# Patient Record
Sex: Female | Born: 1965 | Race: White | Hispanic: No | Smoking: Former smoker
Health system: Southern US, Community
[De-identification: ages and names within clinical notes are randomized; demographics above are authoritative.]

## PROBLEM LIST (undated history)

## (undated) DIAGNOSIS — R519 Headache, unspecified: Secondary | ICD-10-CM

## (undated) DIAGNOSIS — J343 Hypertrophy of nasal turbinates: Secondary | ICD-10-CM

## (undated) DIAGNOSIS — J342 Deviated nasal septum: Secondary | ICD-10-CM

## (undated) DIAGNOSIS — R252 Cramp and spasm: Secondary | ICD-10-CM

## (undated) DIAGNOSIS — R51 Headache: Secondary | ICD-10-CM

## (undated) DIAGNOSIS — I739 Peripheral vascular disease, unspecified: Secondary | ICD-10-CM

## (undated) DIAGNOSIS — C801 Malignant (primary) neoplasm, unspecified: Secondary | ICD-10-CM

## (undated) DIAGNOSIS — Z9109 Other allergy status, other than to drugs and biological substances: Secondary | ICD-10-CM

## (undated) DIAGNOSIS — Z9889 Other specified postprocedural states: Secondary | ICD-10-CM

## (undated) DIAGNOSIS — E039 Hypothyroidism, unspecified: Secondary | ICD-10-CM

## (undated) DIAGNOSIS — R112 Nausea with vomiting, unspecified: Secondary | ICD-10-CM

## (undated) DIAGNOSIS — M1990 Unspecified osteoarthritis, unspecified site: Secondary | ICD-10-CM

## (undated) HISTORY — PX: WISDOM TOOTH EXTRACTION: SHX21

## (undated) HISTORY — DX: Peripheral vascular disease, unspecified: I73.9

## (undated) HISTORY — PX: TUBAL LIGATION: SHX77

## (undated) HISTORY — PX: EYE SURGERY: SHX253

## (undated) HISTORY — PX: TONSILLECTOMY: SUR1361

---

## 2003-04-13 ENCOUNTER — Other Ambulatory Visit: Admission: RE | Admit: 2003-04-13 | Discharge: 2003-04-13 | Payer: Self-pay | Admitting: Obstetrics and Gynecology

## 2010-08-27 ENCOUNTER — Emergency Department (HOSPITAL_COMMUNITY)
Admission: EM | Admit: 2010-08-27 | Discharge: 2010-08-27 | Disposition: A | Discharge status: Home / Self Care | Payer: Managed Care, Other (non HMO) | Attending: Emergency Medicine | Admitting: Emergency Medicine

## 2010-08-27 ENCOUNTER — Emergency Department (HOSPITAL_COMMUNITY): Payer: Managed Care, Other (non HMO)

## 2010-08-27 DIAGNOSIS — M79609 Pain in unspecified limb: Secondary | ICD-10-CM | POA: Insufficient documentation

## 2010-08-27 DIAGNOSIS — S92909A Unspecified fracture of unspecified foot, initial encounter for closed fracture: Secondary | ICD-10-CM | POA: Insufficient documentation

## 2010-08-27 DIAGNOSIS — M25579 Pain in unspecified ankle and joints of unspecified foot: Secondary | ICD-10-CM | POA: Insufficient documentation

## 2010-08-27 DIAGNOSIS — E039 Hypothyroidism, unspecified: Secondary | ICD-10-CM | POA: Insufficient documentation

## 2010-08-27 DIAGNOSIS — W108XXA Fall (on) (from) other stairs and steps, initial encounter: Secondary | ICD-10-CM | POA: Insufficient documentation

## 2012-05-09 IMAGING — CR DG FOOT COMPLETE 3+V*L*
3 series · 3 of 3 positions shown · non-contrast
Comparison: Ankle views today.

CLINICAL DATA: Lateral foot and ankle pain, swelling, bruising.

LEFT FOOT - COMPLETE 3+ VIEW

[t foot ap left]
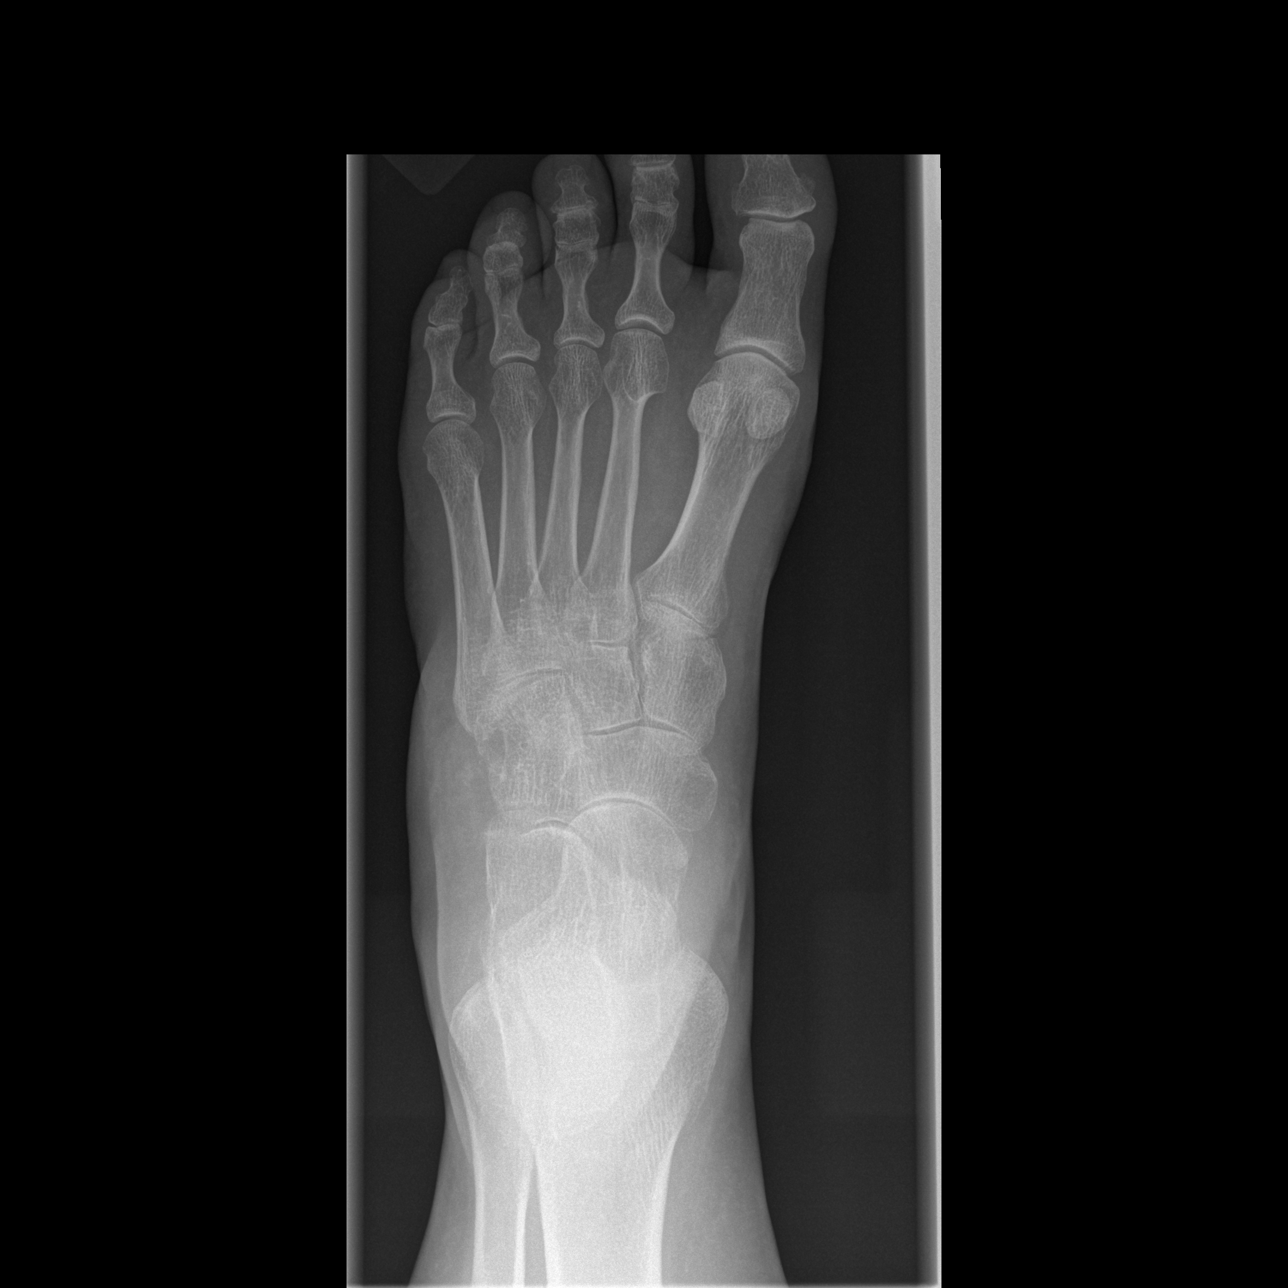

[t foot oblique left]
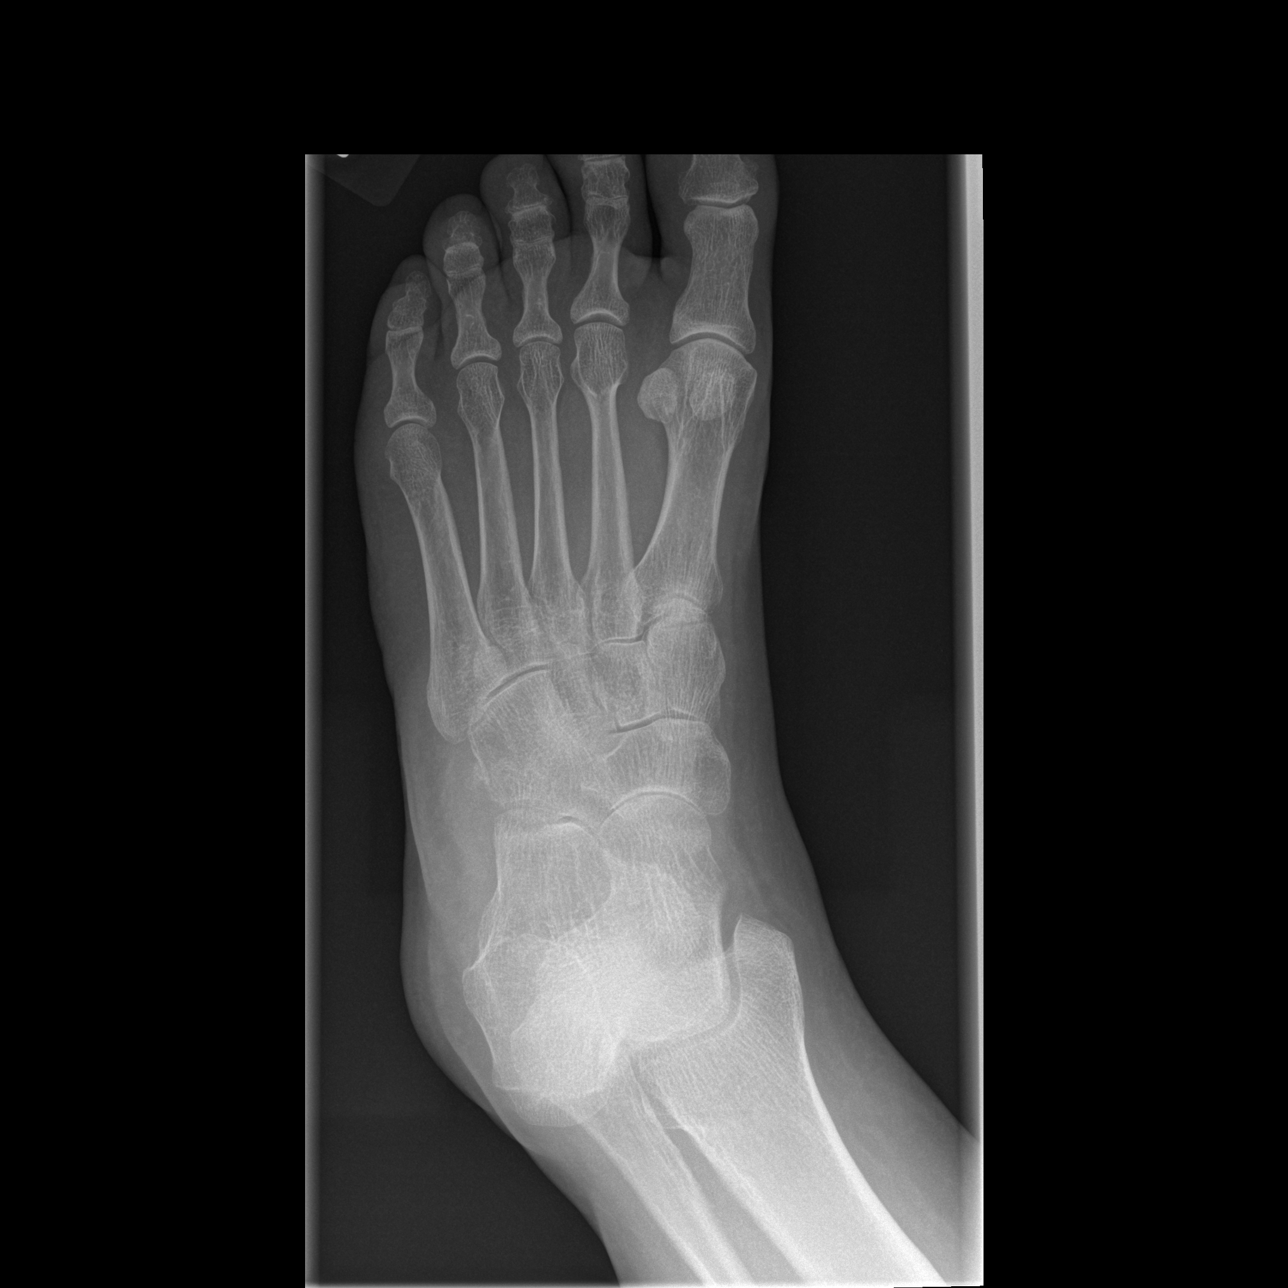

[t foot lat left]
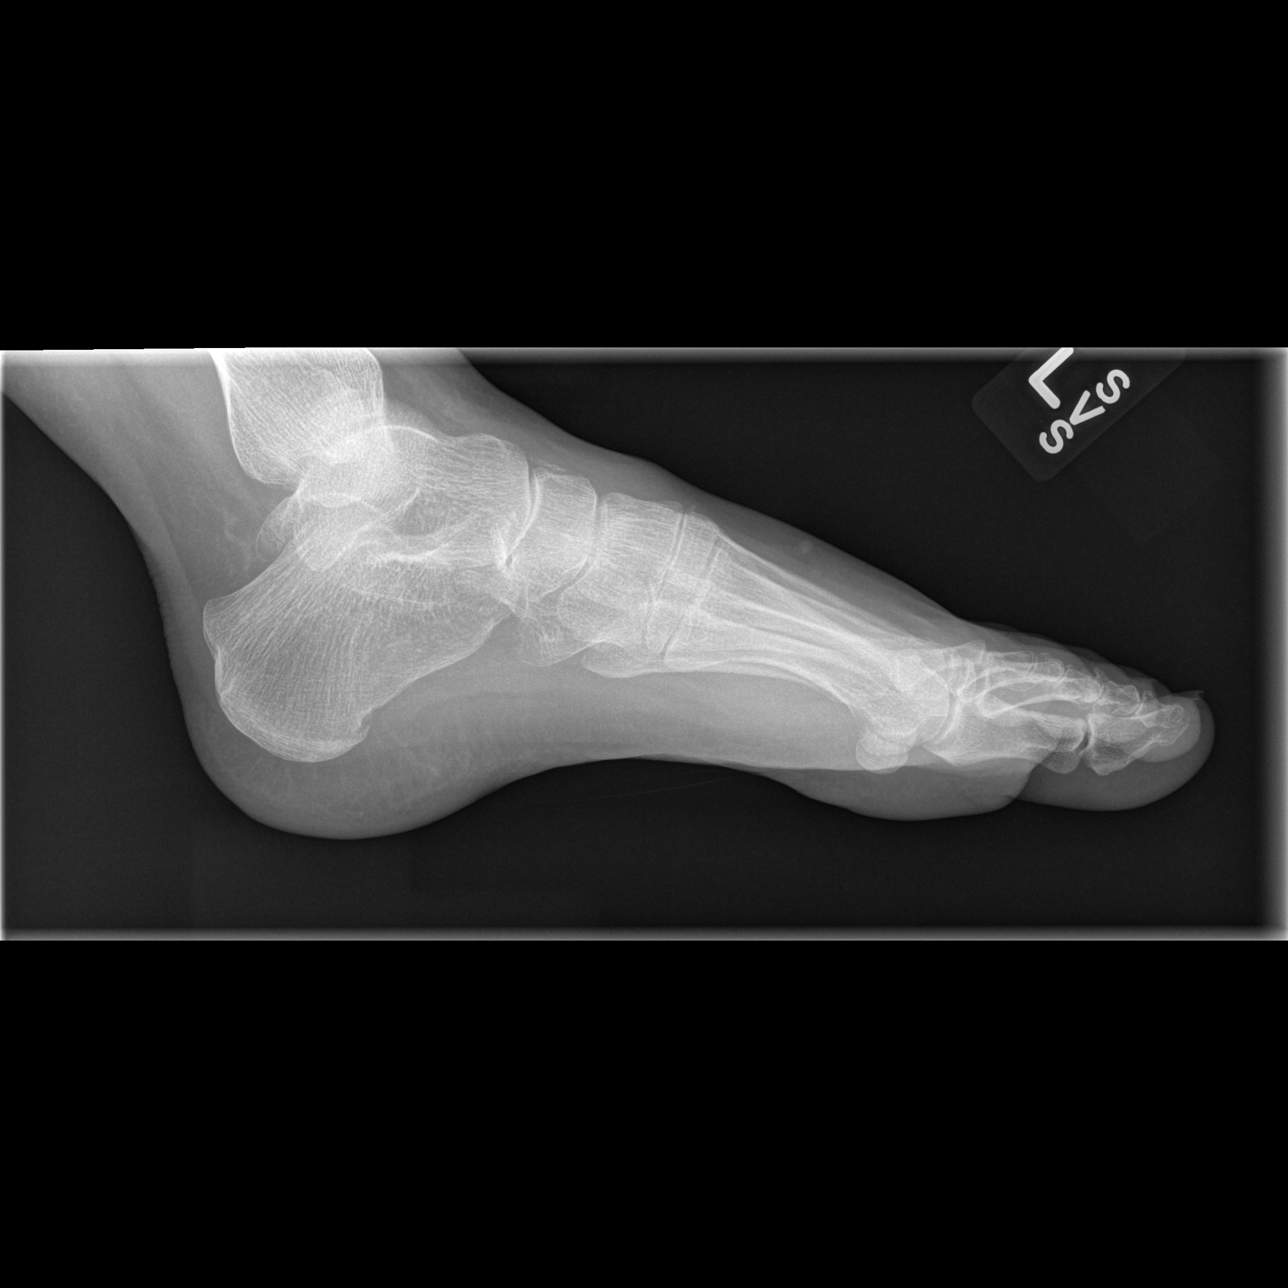

[3 of 3 positions shown; findings below may reference images not displayed]

FINDINGS: There is soft tissue swelling and irregularity in the
region of the cuboid, raising the question of a fracture of the
cuboid.  This may be an avulsion type injury but detail of the
cuboid is limited.  No other fractures are identified.
IMPRESSION: 1.  Significant soft tissue swelling.
2.  Suspect fracture in the region of the cuboid bone.

## 2014-10-13 ENCOUNTER — Encounter: Payer: Self-pay | Admitting: Cardiovascular Disease

## 2014-10-13 ENCOUNTER — Ambulatory Visit (INDEPENDENT_AMBULATORY_CARE_PROVIDER_SITE_OTHER): Payer: Managed Care, Other (non HMO) | Admitting: Cardiovascular Disease

## 2014-10-13 DIAGNOSIS — I739 Peripheral vascular disease, unspecified: Secondary | ICD-10-CM | POA: Diagnosis not present

## 2014-10-13 NOTE — Assessment & Plan Note (Signed)
Katherine Harrison was referred to me by Dr. Harrington Challenger for evaluation of claudication. She's had these symptoms for the last 10 or 15 years. She complains of calf discomfort with ambulation. She also complains of numbness in her hands and feet but apparently has been evaluated by a neurologist for peripheral neuropathy and was told she did not have this. She has no cardiovascular risk factors and 2+ pedal pulses. I doubt whether her symptoms are vascular nature. I will get lower extremity arterial Doppler studies that these are normal I will see her back as needed

## 2014-10-13 NOTE — Patient Instructions (Signed)
Medication Instructions:  Your physician recommends that you continue on your current medications as directed. Please refer to the Current Medication list given to you today.   Labwork: NONE  Testing/Procedures: Your physician has requested that you have a lower extremity arterial doppler- During this test, ultrasound is used to evaluate arterial blood flow in the legs. Allow approximately one hour for this exam.    Follow-Up: Follow up with Dr. Gwenlyn Found as needed.   Any Other Special Instructions Will Be Listed Below (If Applicable).

## 2014-10-13 NOTE — Progress Notes (Signed)
     10/13/2014 Katherine Harrison   1965/07/05  [id number]  Primary Physician No primary care provider on file. Primary Cardiologist: Katherine Harp MD Katherine Harrison   HPI:  Katherine Harrison is a delightful 49 year old moderately overweight married Caucasian female mother of one child who works doing data entry. She was referred by Dr. Harrington Harrison for peripheral vascular evaluation because of symptoms compatible with claudication. Her cardiovascular status profile is notable for remote tobacco abuse having smoked 10-pack-years and stopped over 10 years ago. She has no history of diabetes, hypertension or hyperlipidemia. There is no family history of heart disease. She has never had a heart attack or stroke and denies chest pain or shortness of breath. She did scratch claudication type symptoms over the last 10 or 15 years. She also gets nocturnal cramps in her legs.   Current Outpatient Prescriptions  Medication Sig Dispense Refill  . ARMOUR THYROID 60 MG tablet Take 60 mg by mouth 2 (two) times daily.    . fexofenadine (ALLEGRA) 180 MG tablet Take 180 mg by mouth daily.     No current facility-administered medications for this visit.    Not on File  Social History   Social History  . Marital Status: Married    Spouse Name: N/A  . Number of Children: N/A  . Years of Education: N/A   Occupational History  . Not on file.   Social History Main Topics  . Smoking status: Former Smoker    Quit date: 04/12/1998  . Smokeless tobacco: Not on file  . Alcohol Use: Not on file  . Drug Use: Not on file  . Sexual Activity: Not on file   Other Topics Concern  . Not on file   Social History Narrative  . No narrative on file     Review of Systems: General: negative for chills, fever, night sweats or weight changes.  Cardiovascular: negative for chest pain, dyspnea on exertion, edema, orthopnea, palpitations, paroxysmal nocturnal dyspnea or shortness of breath Dermatological: negative  for rash Respiratory: negative for cough or wheezing Urologic: negative for hematuria Abdominal: negative for nausea, vomiting, diarrhea, bright red blood per rectum, melena, or hematemesis Neurologic: negative for visual changes, syncope, or dizziness All other systems reviewed and are otherwise negative except as noted above.    Blood pressure 116/68, pulse 80, height 5\' 4"  (1.626 m), weight 184 lb (83.462 kg).  General appearance: alert and no distress Neck: no adenopathy, no carotid bruit, no JVD, supple, symmetrical, trachea midline and thyroid not enlarged, symmetric, no tenderness/mass/nodules Lungs: clear to auscultation bilaterally Heart: regular rate and rhythm, S1, S2 normal, no murmur, click, rub or gallop Extremities: extremities normal, atraumatic, no cyanosis or edema and 2+ pedal pulses bilaterally  EKG not performed today  ASSESSMENT AND PLAN:   Claudication Katherine Harrison was referred to me by Dr. Harrington Harrison for evaluation of claudication. She's had these symptoms for the last 10 or 15 years. She complains of calf discomfort with ambulation. She also complains of numbness in her hands and feet but apparently has been evaluated by a neurologist for peripheral neuropathy and was told she did not have this. She has no cardiovascular risk factors and 2+ pedal pulses. I doubt whether her symptoms are vascular nature. I will get lower extremity arterial Doppler studies that these are normal I will see her back as needed      Katherine Harp MD Katherine Harrison, Katherine Harrison 10/13/2014 8:59 AM

## 2014-10-23 ENCOUNTER — Other Ambulatory Visit: Payer: Self-pay | Admitting: Cardiovascular Disease

## 2014-10-23 DIAGNOSIS — I739 Peripheral vascular disease, unspecified: Secondary | ICD-10-CM

## 2014-10-29 ENCOUNTER — Ambulatory Visit (HOSPITAL_COMMUNITY)
Admission: RE | Admit: 2014-10-29 | Discharge: 2014-10-29 | Disposition: A | Discharge status: Home / Self Care | Payer: Managed Care, Other (non HMO) | Source: Ambulatory Visit | Attending: Internal Medicine | Admitting: Internal Medicine

## 2014-10-29 DIAGNOSIS — I739 Peripheral vascular disease, unspecified: Secondary | ICD-10-CM | POA: Diagnosis not present

## 2015-02-02 ENCOUNTER — Other Ambulatory Visit: Payer: Self-pay | Admitting: Obstetrics and Gynecology

## 2015-02-26 NOTE — Patient Instructions (Signed)
Your procedure is scheduled on:  Thursday, Feb. 9, 2017  Enter through the [employer] [facility] at:  10:30 A.M.  Pick up the phone at the desk and dial [phone].  Call this number if you have problems the morning of surgery: [phone].  Remember: Do NOT eat food:  After Midnight Wednesday Do NOT drink clear liquids after:  8:00 A.M. Day of surgery  Take these medicines the morning of surgery with a SIP OF WATER:  Armour thyroid  Do NOT wear jewelry (body piercing), metal hair clips/bobby pins, make-up, or nail polish. Do NOT wear lotions, powders, or perfumes.  You may wear deoderant. Do NOT shave for 48 hours prior to surgery. Do NOT bring valuables to the hospital. Contacts, dentures, or bridgework may not be worn into surgery.  Have a responsible adult drive you home and stay with you for 24 hours after your procedure

## 2015-03-01 ENCOUNTER — Encounter (HOSPITAL_COMMUNITY): Payer: Self-pay

## 2015-03-01 ENCOUNTER — Encounter (HOSPITAL_COMMUNITY)
Admission: RE | Admit: 2015-03-01 | Discharge: 2015-03-01 | Disposition: A | Discharge status: Home / Self Care | Payer: Managed Care, Other (non HMO) | Source: Ambulatory Visit | Attending: Obstetrics and Gynecology | Admitting: Obstetrics and Gynecology

## 2015-03-01 DIAGNOSIS — D259 Leiomyoma of uterus, unspecified: Secondary | ICD-10-CM | POA: Diagnosis not present

## 2015-03-01 DIAGNOSIS — N393 Stress incontinence (female) (male): Secondary | ICD-10-CM | POA: Diagnosis not present

## 2015-03-01 DIAGNOSIS — Z87891 Personal history of nicotine dependence: Secondary | ICD-10-CM | POA: Diagnosis not present

## 2015-03-01 DIAGNOSIS — N920 Excessive and frequent menstruation with regular cycle: Secondary | ICD-10-CM | POA: Diagnosis present

## 2015-03-01 HISTORY — DX: Unspecified osteoarthritis, unspecified site: M19.90

## 2015-03-01 HISTORY — DX: Hypothyroidism, unspecified: E03.9

## 2015-03-01 HISTORY — DX: Headache, unspecified: R51.9

## 2015-03-01 HISTORY — DX: Headache: R51

## 2015-03-01 HISTORY — DX: Other allergy status, other than to drugs and biological substances: Z91.09

## 2015-03-01 HISTORY — DX: Cramp and spasm: R25.2

## 2015-03-01 LAB — CBC
HEMATOCRIT: 36.7 % (ref 36.0–46.0)
HEMOGLOBIN: 11.9 g/dL — AB (ref 12.0–15.0)
MCH: 25.8 pg — ABNORMAL LOW (ref 26.0–34.0)
MCHC: 32.4 g/dL (ref 30.0–36.0)
MCV: 79.4 fL (ref 78.0–100.0)
Platelets: 164 10*3/uL (ref 150–400)
RBC: 4.62 MIL/uL (ref 3.87–5.11)
RDW: 15 % (ref 11.5–15.5)
WBC: 7.4 10*3/uL (ref 4.0–10.5)

## 2015-03-03 MED ORDER — CEFAZOLIN SODIUM-DEXTROSE 2-3 GM-% IV SOLR: 2.0000 g | INTRAVENOUS | Status: DC

## 2015-03-04 ENCOUNTER — Encounter (HOSPITAL_COMMUNITY)
Admission: RE | Disposition: A | Discharge status: Home / Self Care | Payer: Self-pay | Source: Ambulatory Visit | Attending: Obstetrics and Gynecology

## 2015-03-04 ENCOUNTER — Ambulatory Visit (HOSPITAL_COMMUNITY): Payer: Managed Care, Other (non HMO) | Admitting: Certified Registered Nurse Anesthetist

## 2015-03-04 ENCOUNTER — Encounter (HOSPITAL_COMMUNITY): Payer: Self-pay | Admitting: Emergency Medicine

## 2015-03-04 ENCOUNTER — Ambulatory Visit (HOSPITAL_COMMUNITY)
Admission: RE | Admit: 2015-03-04 | Discharge: 2015-03-05 | Disposition: A | Discharge status: Home / Self Care | Payer: Managed Care, Other (non HMO) | Source: Ambulatory Visit | Attending: Obstetrics and Gynecology | Admitting: Obstetrics and Gynecology

## 2015-03-04 DIAGNOSIS — D259 Leiomyoma of uterus, unspecified: Secondary | ICD-10-CM | POA: Insufficient documentation

## 2015-03-04 DIAGNOSIS — N920 Excessive and frequent menstruation with regular cycle: Secondary | ICD-10-CM | POA: Diagnosis not present

## 2015-03-04 DIAGNOSIS — Z87891 Personal history of nicotine dependence: Secondary | ICD-10-CM | POA: Insufficient documentation

## 2015-03-04 DIAGNOSIS — N393 Stress incontinence (female) (male): Secondary | ICD-10-CM | POA: Diagnosis present

## 2015-03-04 HISTORY — PX: CYSTOSCOPY: SHX5120

## 2015-03-04 HISTORY — DX: Nausea with vomiting, unspecified: Z98.890

## 2015-03-04 HISTORY — PX: DILITATION & CURRETTAGE/HYSTROSCOPY WITH HYDROTHERMAL ABLATION: SHX5570

## 2015-03-04 HISTORY — PX: BLADDER SUSPENSION: SHX72

## 2015-03-04 HISTORY — DX: Nausea with vomiting, unspecified: R11.2

## 2015-03-04 SURGERY — DILATATION & CURETTAGE/HYSTEROSCOPY WITH HYDROTHERMAL ABLATION
Anesthesia: Epidural | Site: Vagina

## 2015-03-04 MED ORDER — DEXTROSE IN LACTATED RINGERS 5 % IV SOLN
INTRAVENOUS | Status: DC
  Administered 2015-03-04 (×2): via INTRAVENOUS

## 2015-03-04 MED ORDER — SCOPOLAMINE 1 MG/3DAYS TD PT72
MEDICATED_PATCH | TRANSDERMAL | Status: AC
  Administered 2015-03-04: 1.5 mg via TRANSDERMAL
  Filled 2015-03-04: qty 1

## 2015-03-04 MED ORDER — ONDANSETRON HCL 4 MG/2ML IJ SOLN
INTRAMUSCULAR | Status: AC
  Filled 2015-03-04: qty 2

## 2015-03-04 MED ORDER — LIDOCAINE HCL (CARDIAC) 20 MG/ML IV SOLN
INTRAVENOUS | Status: DC | PRN
  Administered 2015-03-04: 100 mg via INTRAVENOUS

## 2015-03-04 MED ORDER — PROMETHAZINE HCL 25 MG/ML IJ SOLN
6.2500 mg | INTRAMUSCULAR | Status: DC | PRN
  Administered 2015-03-04: 6.25 mg via INTRAVENOUS

## 2015-03-04 MED ORDER — FENTANYL CITRATE (PF) 100 MCG/2ML IJ SOLN
INTRAMUSCULAR | Status: DC | PRN
  Administered 2015-03-04 (×5): 50 ug via INTRAVENOUS

## 2015-03-04 MED ORDER — HYDROMORPHONE HCL 2 MG PO TABS
2.0000 mg | ORAL_TABLET | ORAL | Status: DC | PRN
  Administered 2015-03-04 – 2015-03-05 (×3): 2 mg via ORAL
  Filled 2015-03-04 (×3): qty 1

## 2015-03-04 MED ORDER — LIDOCAINE HCL (CARDIAC) 20 MG/ML IV SOLN
INTRAVENOUS | Status: AC
  Filled 2015-03-04: qty 5

## 2015-03-04 MED ORDER — FENTANYL CITRATE (PF) 250 MCG/5ML IJ SOLN
INTRAMUSCULAR | Status: AC
  Filled 2015-03-04: qty 5

## 2015-03-04 MED ORDER — ONDANSETRON HCL 4 MG PO TABS: 4.0000 mg | ORAL_TABLET | Freq: Four times a day (QID) | ORAL | Status: DC | PRN

## 2015-03-04 MED ORDER — LIDOCAINE HCL 1 % IJ SOLN
INTRAMUSCULAR | Status: AC
  Filled 2015-03-04: qty 20

## 2015-03-04 MED ORDER — ONDANSETRON HCL 4 MG/2ML IJ SOLN
4.0000 mg | Freq: Four times a day (QID) | INTRAMUSCULAR | Status: DC | PRN
  Administered 2015-03-04: 4 mg via INTRAVENOUS
  Filled 2015-03-04: qty 2

## 2015-03-04 MED ORDER — LIDOCAINE-EPINEPHRINE 1 %-1:100000 IJ SOLN
INTRAMUSCULAR | Status: AC
  Filled 2015-03-04: qty 1

## 2015-03-04 MED ORDER — MEPERIDINE HCL 25 MG/ML IJ SOLN: 6.2500 mg | INTRAMUSCULAR | Status: DC | PRN

## 2015-03-04 MED ORDER — DEXAMETHASONE SODIUM PHOSPHATE 4 MG/ML IJ SOLN
INTRAMUSCULAR | Status: AC
  Filled 2015-03-04: qty 1

## 2015-03-04 MED ORDER — PROMETHAZINE HCL 25 MG/ML IJ SOLN
INTRAMUSCULAR | Status: AC
  Administered 2015-03-04: 6.25 mg via INTRAVENOUS
  Filled 2015-03-04: qty 1

## 2015-03-04 MED ORDER — SCOPOLAMINE 1 MG/3DAYS TD PT72
1.0000 | MEDICATED_PATCH | Freq: Once | TRANSDERMAL | Status: DC
  Administered 2015-03-04: 1.5 mg via TRANSDERMAL

## 2015-03-04 MED ORDER — SODIUM CHLORIDE 0.9 % IR SOLN
Status: DC | PRN
  Administered 2015-03-04: 3000 mL

## 2015-03-04 MED ORDER — KETOROLAC TROMETHAMINE 30 MG/ML IJ SOLN
INTRAMUSCULAR | Status: DC | PRN
  Administered 2015-03-04: 30 mg via INTRAVENOUS

## 2015-03-04 MED ORDER — FENTANYL CITRATE (PF) 100 MCG/2ML IJ SOLN
INTRAMUSCULAR | Status: AC
  Filled 2015-03-04: qty 2

## 2015-03-04 MED ORDER — MIDAZOLAM HCL 2 MG/2ML IJ SOLN
INTRAMUSCULAR | Status: DC | PRN
  Administered 2015-03-04: 2 mg via INTRAVENOUS

## 2015-03-04 MED ORDER — SIMETHICONE 80 MG PO CHEW: 80.0000 mg | CHEWABLE_TABLET | Freq: Four times a day (QID) | ORAL | Status: DC | PRN

## 2015-03-04 MED ORDER — FENTANYL CITRATE (PF) 100 MCG/2ML IJ SOLN
25.0000 ug | INTRAMUSCULAR | Status: DC | PRN
  Administered 2015-03-04: 50 ug via INTRAVENOUS

## 2015-03-04 MED ORDER — THYROID 60 MG PO TABS
120.0000 mg | ORAL_TABLET | Freq: Every day | ORAL | Status: DC
  Administered 2015-03-05: 120 mg via ORAL
  Filled 2015-03-04 (×2): qty 2

## 2015-03-04 MED ORDER — MIDAZOLAM HCL 2 MG/2ML IJ SOLN
INTRAMUSCULAR | Status: AC
  Filled 2015-03-04: qty 2

## 2015-03-04 MED ORDER — DEXAMETHASONE SODIUM PHOSPHATE 10 MG/ML IJ SOLN
INTRAMUSCULAR | Status: DC | PRN
  Administered 2015-03-04: 4 mg via INTRAVENOUS

## 2015-03-04 MED ORDER — ONDANSETRON HCL 4 MG/2ML IJ SOLN
INTRAMUSCULAR | Status: DC | PRN
  Administered 2015-03-04: 4 mg via INTRAVENOUS

## 2015-03-04 MED ORDER — SODIUM CHLORIDE 0.9 % IJ SOLN
INTRAMUSCULAR | Status: AC
  Filled 2015-03-04: qty 50

## 2015-03-04 MED ORDER — SODIUM CHLORIDE 0.9 % IJ SOLN
INTRAMUSCULAR | Status: DC | PRN
  Administered 2015-03-04: 50 mL

## 2015-03-04 MED ORDER — LACTATED RINGERS IV SOLN
INTRAVENOUS | Status: DC
  Administered 2015-03-04 (×2): via INTRAVENOUS

## 2015-03-04 MED ORDER — PROPOFOL 10 MG/ML IV BOLUS
INTRAVENOUS | Status: AC
  Filled 2015-03-04: qty 20

## 2015-03-04 MED ORDER — PROPOFOL 10 MG/ML IV BOLUS
INTRAVENOUS | Status: DC | PRN
  Administered 2015-03-04: 200 mg via INTRAVENOUS

## 2015-03-04 MED ORDER — STERILE WATER FOR IRRIGATION IR SOLN
Status: DC | PRN
  Administered 2015-03-04: 1000 mL

## 2015-03-04 MED ORDER — CEFAZOLIN SODIUM-DEXTROSE 2-3 GM-% IV SOLR
INTRAVENOUS | Status: AC
  Administered 2015-03-04: 2 g via INTRAVENOUS
  Filled 2015-03-04: qty 50

## 2015-03-04 MED ORDER — KETOROLAC TROMETHAMINE 30 MG/ML IJ SOLN: 30.0000 mg | Freq: Once | INTRAMUSCULAR | Status: DC

## 2015-03-04 MED ORDER — LIDOCAINE-EPINEPHRINE 1 %-1:100000 IJ SOLN
INTRAMUSCULAR | Status: DC | PRN
  Administered 2015-03-04: 60 mL

## 2015-03-04 MED ORDER — KETOROLAC TROMETHAMINE 30 MG/ML IJ SOLN
INTRAMUSCULAR | Status: AC
  Filled 2015-03-04: qty 1

## 2015-03-04 SURGICAL SUPPLY — 32 items
BLADE SURG 15 STRL LF C SS BP (BLADE) ×2 IMPLANT
BLADE SURG 15 STRL SS (BLADE) ×2
CANISTER SUCT 3000ML (MISCELLANEOUS) ×4 IMPLANT
CATH FOLEY 2WAY SLVR  5CC 18FR (CATHETERS) ×2
CATH FOLEY 2WAY SLVR 5CC 18FR (CATHETERS) ×2 IMPLANT
CATH ROBINSON RED A/P 16FR (CATHETERS) ×4 IMPLANT
CLOTH BEACON ORANGE TIMEOUT ST (SAFETY) ×4 IMPLANT
CONTAINER PREFILL 10% NBF 60ML (FORM) ×4 IMPLANT
DECANTER SPIKE VIAL GLASS SM (MISCELLANEOUS) IMPLANT
GAUZE PACKING IODOFORM 2 (PACKING) IMPLANT
GLOVE BIOGEL PI IND STRL 7.0 (GLOVE) ×2 IMPLANT
GLOVE BIOGEL PI INDICATOR 7.0 (GLOVE) ×2
GLOVE ECLIPSE 7.0 STRL STRAW (GLOVE) ×8 IMPLANT
GOWN STRL REUS W/TWL LRG LVL3 (GOWN DISPOSABLE) ×8 IMPLANT
LIQUID BAND (GAUZE/BANDAGES/DRESSINGS) ×4 IMPLANT
NEEDLE SPNL 18GX3.5 QUINCKE PK (NEEDLE) ×4 IMPLANT
NS IRRIG 1000ML POUR BTL (IV SOLUTION) ×4 IMPLANT
PACK VAGINAL MINOR WOMEN LF (CUSTOM PROCEDURE TRAY) ×4 IMPLANT
PACK VAGINAL WOMENS (CUSTOM PROCEDURE TRAY) IMPLANT
PAD OB MATERNITY 4.3X12.25 (PERSONAL CARE ITEMS) ×4 IMPLANT
SET CYSTO W/LG BORE CLAMP LF (SET/KITS/TRAYS/PACK) ×4 IMPLANT
SET GENESYS HTA PROCERVA (MISCELLANEOUS) ×4 IMPLANT
SLING TVT EXACT (Sling) ×4 IMPLANT
SUT VIC AB 2-0 CT1 27 (SUTURE) ×2
SUT VIC AB 2-0 CT1 TAPERPNT 27 (SUTURE) ×2 IMPLANT
SYR 20CC LL (SYRINGE) ×4 IMPLANT
TOWEL OR 17X24 6PK STRL BLUE (TOWEL DISPOSABLE) ×8 IMPLANT
TRAY FOLEY CATH SILVER 14FR (SET/KITS/TRAYS/PACK) ×4 IMPLANT
TUBING NON-CON 1/4 X 20 CONN (TUBING) ×3 IMPLANT
TUBING NON-CON 1/4 X 20' CONN (TUBING) ×1
WATER STERILE IRR 1000ML POUR (IV SOLUTION) ×4 IMPLANT
YANKAUER SUCT BULB TIP NO VENT (SUCTIONS) ×4 IMPLANT

## 2015-03-04 NOTE — Transfer of Care (Signed)
Immediate Anesthesia Transfer of Care Note  Patient: Katherine Harrison  Procedure(s) Performed: Procedure(s): DILATATION & CURETTAGE/HYSTEROSCOPY WITH HYDROTHERMAL ABLATION (N/A) TRANSVAGINAL TAPE (TVT) PROCEDURE (N/A) CYSTOSCOPY (N/A)  Patient Location: PACU  Anesthesia Type:General  Level of Consciousness: awake, alert  and oriented  Airway & Oxygen Therapy: Patient Spontanous Breathing and Patient connected to nasal cannula oxygen  Post-op Assessment: Report given to RN, Post -op Vital signs reviewed and stable and Patient moving all extremities  Post vital signs: Reviewed and stable  Last Vitals:  Filed Vitals:   03/04/15 1023  BP: 140/75  Pulse: 81  Temp: 36.8 C  Resp: 18    Complications: No apparent anesthesia complications

## 2015-03-04 NOTE — Anesthesia Procedure Notes (Signed)
Procedure Name: LMA Insertion Date/Time: 03/04/2015 12:01 PM Performed by: Hewitt Blade Pre-anesthesia Checklist: Patient identified, Emergency Drugs available, Suction available and Patient being monitored Patient Re-evaluated:Patient Re-evaluated prior to inductionOxygen Delivery Method: Circle system utilized Preoxygenation: Pre-oxygenation with 100% oxygen Intubation Type: IV induction Ventilation: Mask ventilation without difficulty LMA Size: 4.0 Number of attempts: 1 Placement Confirmation: positive ETCO2 and breath sounds checked- equal and bilateral Tube secured with: Tape Dental Injury: Teeth and Oropharynx as per pre-operative assessment

## 2015-03-04 NOTE — Anesthesia Preprocedure Evaluation (Addendum)
Anesthesia Evaluation  Patient identified by MRN, date of birth, ID band Patient awake    Reviewed: Allergy & Precautions, H&P , NPO status , Patient's Chart, lab work & pertinent test results  Airway Mallampati: II  TM Distance: >3 FB Neck ROM: full    Dental no notable dental hx. (+) Teeth Intact   Pulmonary neg pulmonary ROS, former smoker,    Pulmonary exam normal        Cardiovascular negative cardio ROS Normal cardiovascular exam     Neuro/Psych negative psych ROS   GI/Hepatic negative GI ROS, Neg liver ROS,   Endo/Other    Renal/GU negative Renal ROS     Musculoskeletal   Abdominal (+) + obese,   Peds  Hematology negative hematology ROS (+)   Anesthesia Other Findings   Reproductive/Obstetrics negative OB ROS                            Anesthesia Physical Anesthesia Plan  ASA: II  Anesthesia Plan: General   Post-op Pain Management:    Induction: Intravenous  Airway Management Planned: LMA  Additional Equipment:   Intra-op Plan:   Post-operative Plan:   Informed Consent: I have reviewed the patients History and Physical, chart, labs and discussed the procedure including the risks, benefits and alternatives for the proposed anesthesia with the patient or authorized representative who has indicated his/her understanding and acceptance.     Plan Discussed with: CRNA and Surgeon  Anesthesia Plan Comments:        Anesthesia Quick Evaluation

## 2015-03-04 NOTE — H&P (Signed)
Katherine Harrison is an 50 y.o. white female, G1P1 who presents to the OR for an HTA ablation and TVT for a several yr h.o. Worsening menorrhagia and SUI. She also has know uterine fibroids. She has a nl endometrial biopsy. She has had a BTL.   Past Medical History  Diagnosis Date  . Claudication ([place])   . Environmental allergies   . Hypothyroidism   . Headache     migraine, visual aura-take aleve immediately to avoid migraines  . Muscle cramps     bilateral legs  . OA (osteoarthritis)   . PONV (postoperative nausea and vomiting)     Past Surgical History  Procedure Laterality Date  . Wisdom tooth extraction    . Tonsillectomy    . Eye surgery      eye muscle shortening surgery as toddler  . Tubal ligation      History reviewed. No pertinent family history. Social History:  reports that she quit smoking about 16 years ago. Her smoking use included Cigarettes. She has a 7.5 pack-year smoking history. She has never used smokeless tobacco. She reports that she drinks alcohol. She reports that she does not use illicit drugs.  Allergies:   Blood pressure 140/75, pulse 81, temperature 98.2 F (36.8 C), temperature source Oral, resp. rate 18, last menstrual period 02/10/2015, SpO2 100 %. General appearance: alert Lungs: clear to auscultation bilaterally Heart: regular rate and rhythm, S1, S2 normal, no murmur, click, rub or gallop Abdomen: soft, non-tender; bowel sounds normal; no masses,  no organomegaly   Lab Results  Component Value Date   WBC 7.4 03/01/2015   HGB 11.9* 03/01/2015   HCT 36.7 03/01/2015   MCV 79.4 03/01/2015   PLT 164 03/01/2015   No results found for: PREGTESTUR, PREGSERUM, HCG, HCGQUANT    Patient Active Problem List   Diagnosis Date Noted  . Claudication ([place]) 10/13/2014   IMP/ menorrhagia          SUI Plan/ Proceed with Hysteroscopy/ablation/D&C/TVT/cysto  ANDERSON,MARK E 03/04/2015, 11:21 AM

## 2015-03-04 NOTE — Anesthesia Postprocedure Evaluation (Signed)
Anesthesia Post Note  Patient: Katherine Harrison  Procedure(s) Performed: Procedure(s) (LRB): DILATATION & CURETTAGE/HYSTEROSCOPY WITH HYDROTHERMAL ABLATION (N/A) TRANSVAGINAL TAPE (TVT) PROCEDURE (N/A) CYSTOSCOPY (N/A)  Patient location during evaluation: PACU Anesthesia Type: General Level of consciousness: sedated Pain management: pain level controlled Vital Signs Assessment: post-procedure vital signs reviewed and stable Respiratory status: spontaneous breathing Cardiovascular status: stable Anesthetic complications: yes Anesthetic complication details: PONVComments: Being treated forPONV with Phenergan after all meds per protocol had been given.    Last Vitals:  Filed Vitals:   03/04/15 1023  BP: 140/75  Pulse: 81  Temp: 36.8 C  Resp: 18    Last Pain: There were no vitals filed for this visit.               [place]

## 2015-03-05 ENCOUNTER — Encounter (HOSPITAL_COMMUNITY): Payer: Self-pay | Admitting: Obstetrics and Gynecology

## 2015-03-05 DIAGNOSIS — N920 Excessive and frequent menstruation with regular cycle: Secondary | ICD-10-CM | POA: Diagnosis not present

## 2015-03-05 MED ORDER — HYDROMORPHONE HCL 2 MG PO TABS
2.0000 mg | ORAL_TABLET | ORAL | Status: DC | PRN
Start: 2015-03-05 — End: 2022-08-07

## 2015-03-05 NOTE — Progress Notes (Signed)
Continued voiding trails:  11:48 am - Pt voided 550, bladder scan 310 13:41 pm - Pt voided 600, bladder scan 229  Pt feels that her bladder is empty after she voids.  Phoned Dr. Ouida Sills with update.  He stated that patient could be discharged as ordered earlier.  Pt is to call MD if she feels unable to empty her over the weekend and she should RTO on Monday or Tuesday to be bladder scanned in the office.

## 2015-03-05 NOTE — Op Note (Signed)
NAMEMALONIE, DEGREE NO.:  [id number]  MEDICAL RECORD NO.:  [id number]  LOCATION:  9302                          FACILITY:  [facility]  PHYSICIAN:  Freda Munro, M.D.    DATE OF BIRTH:  1965/10/15  DATE OF PROCEDURE:  03/04/2015 DATE OF DISCHARGE:                              OPERATIVE REPORT   PREOPERATIVE DIAGNOSES: 1. Menorrhagia. 2. Uterine fibroids. 3. Stress urinary incontinence.  POSTOPERATIVE DIAGNOSES: 1. Menorrhagia. 2. Uterine fibroids. 3. Stress urinary incontinence.  PROCEDURE: 1. Hysteroscopy. 2. HTA endometrial ablation. 3. TVT Exact urethropexy. 4. Cystoscopy.  SURGEON:  Freda Munro, MD  ANESTHESIA:  General and local.  ANTIBIOTICS:  Ancef 2 g.  DRAINS:  Foley bedside drainage.  ESTIMATED BLOOD LOSS:  40 mL.  SPECIMENS:  None.  COMPLICATIONS:  None.  PROCEDURE IN DETAIL:  The patient was taken to the operating room, where she was placed in a dorsal supine position.  A general anesthetic was administered without difficulty.  She was then placed in a dorsal lithotomy position.  She was prepped and draped in usual fashion for this procedure.  Her bladder was drained with a red rubber catheter.  A sterile speculum was placed in the vagina.  Her cervix was injected with 1% lidocaine for a paracervical block.  Single-tooth tenaculum was applied to the anterior cervical lip.  The cervix was serially dilated with 29-French.  The hysteroscope was set up for the HT ablation.  The hysteroscope was advanced through the endocervical canal which appeared to be normal on entering the uterine cavity.  The patient had thickened endometrium.  However, there was no evidence of a submucous fibroid. Once this was accomplished, HTA machine was ran through its preop set up.  Once no leak was identified, the machine was turned on.  A very adequate ablation was completed.  The device was removed.  Single-tooth tenaculum was removed and the speculum  removed.  This concluded the endometrial ablation.  At this point, a weighted speculum was placed in vagina.  A dilute solution of 1% lidocaine was placed in the retropubic space about 40 mL was placed there and also in the anterior abdominal wall, 10 mL periurethral on both sides.  At this point, a 1 cm incision was made 1 cm distal from the urethral meatus.  Metzenbaum scissors were used to open the periurethral space bilaterally.  At this point, the TVT Exact device was set up.  A catheter on a stylette was placed in the bladder and the bladder was deviated to the patient's left.  The trocar was placed in the right periurethral space and then up through the retropubic space and then through the skin.  A similar procedure was performed on the opposite side.  At this point, cystoscopy was performed.  There was no evidence of any foreign material in the urethra or the bladder.  Both ureters were seen to be functioning normally. There was no evidence of any foreign bodies or stones in the bladder. The bladder wall appeared to be completely normal.  At this point, the bladder was drained.  The cystoscope removed and the sling tightened after a Kelly clamp was placed between the  sling and the urethra.  At this point, the plastic sleeves on the sling were removed and final placement and adjustment of the sling was performed.  The excess material on the sling was removed and then the vaginal opening was closed with 2-0 Vicryl in a running, locking fashion.  Urine was seen to be clear coming from the bladder after a Foley catheter was placed. This concluded the procedure.  The patient was awoken and taken to recovery room in stable condition.  Instrument and lap count was correct x2.          ______________________________ Freda Munro, M.D.     MA/MEDQ  D:  03/04/2015  T:  03/05/2015  Job:  [id number]

## 2015-03-05 NOTE — Anesthesia Postprocedure Evaluation (Signed)
Anesthesia Post Note  Patient: Katherine Harrison  Procedure(s) Performed: Procedure(s) (LRB): DILATATION & CURETTAGE/HYSTEROSCOPY WITH HYDROTHERMAL ABLATION (N/A) TRANSVAGINAL TAPE (TVT) PROCEDURE (N/A) CYSTOSCOPY (N/A)  Patient location during evaluation: [facility] Anesthesia Type: General Level of consciousness: awake and alert and oriented Pain management: pain level controlled Vital Signs Assessment: post-procedure vital signs reviewed and stable Respiratory status: spontaneous breathing Cardiovascular status: blood pressure returned to baseline Postop Assessment: adequate PO intake and no signs of nausea or vomiting (initial postop nausea & vomiting, resolved) Anesthetic complications: no    Last Vitals:  Filed Vitals:   03/05/15 0124 03/05/15 0611  BP: 134/64 138/49  Pulse: 94 75  Temp: 36.9 C 36.6 C  Resp: 18 18    Last Pain:  Filed Vitals:   03/05/15 0830  PainSc: 5                  Hamdi Vari

## 2015-03-05 NOTE — Progress Notes (Signed)
Voiding trials:  6:12 am foley catheter removed with 1600 ml urine in bag 7:48 pt voided 300 ml 10:06 pt voided 450 ml 10:28 pt voided 550 ml, bladder scan 212 ml  Phoned Dr. Ouida Sills with above results.  He instructed nursing to measure 2 more voids and bladder scan each, then call him with results.  Shared with patient who stated she would call RN when she voids.

## 2015-03-05 NOTE — Progress Notes (Signed)
Pt discharged to home with husband.  Condition stable. Discharge instructions reviewed with pt and husband together.  Pt to car via wheelchair with RN.  No equipment for home ordered at discharge.

## 2015-03-05 NOTE — Addendum Note (Signed)
Addendum  created 03/05/15 0919 by Talbot Grumbling, CRNA   Modules edited: Clinical Notes   Clinical Notes:  File: [id number]

## 2015-03-05 NOTE — Progress Notes (Signed)
POD#1 Pt withoput complaints. Now doing voiding trial. She tolerates diet. Adequate pain control Will plan on discharge later today

## 2019-03-24 ENCOUNTER — Other Ambulatory Visit: Payer: Self-pay

## 2019-03-24 ENCOUNTER — Ambulatory Visit: Payer: 59

## 2019-03-24 ENCOUNTER — Other Ambulatory Visit: Payer: Self-pay | Admitting: Physician Assistant

## 2019-03-24 DIAGNOSIS — R202 Paresthesia of skin: Secondary | ICD-10-CM

## 2019-03-24 DIAGNOSIS — R600 Localized edema: Secondary | ICD-10-CM

## 2020-12-04 IMAGING — US US EXTREM LOW VENOUS*L*
1 series · 13 of 24 positions shown · non-contrast
Comparison: None.

CLINICAL DATA: Left lower extremity edema and paresthesia. Bruising
reported.

EXAM:
LEFT LOWER EXTREMITY VENOUS DOPPLER ULTRASOUND
TECHNIQUE: Gray-scale sonography with compression, as well as color and duplex
ultrasound, were performed to evaluate the deep venous system(s)
from the level of the common femoral vein through the popliteal and
proximal calf veins.

[Series 1: us extrem low venous*left* · 0.07mm/px · 13 of 42 slices shown]
[im 1/42]
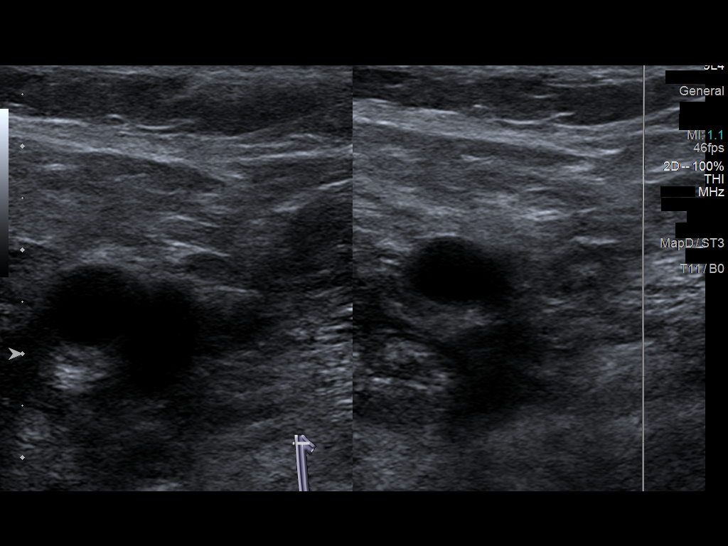
[im 4/42]
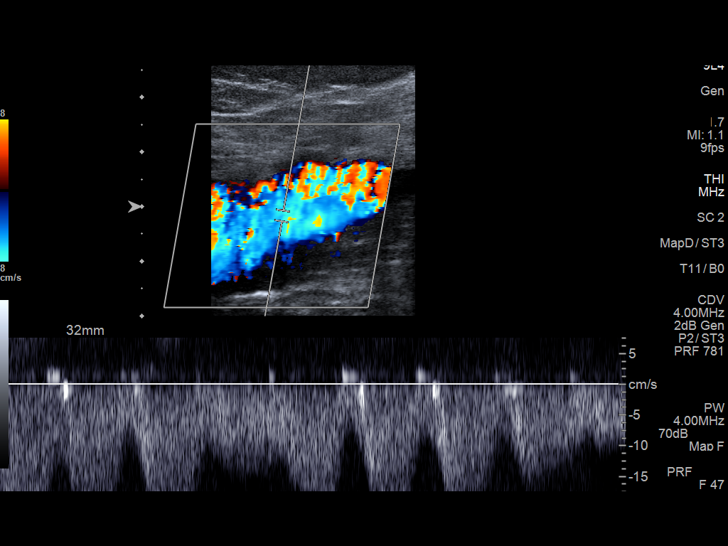
[im 8/42]
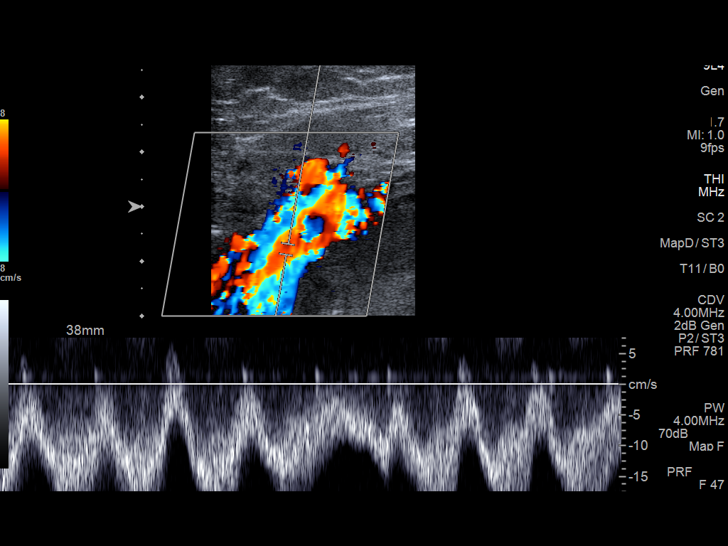
[im 11/42]
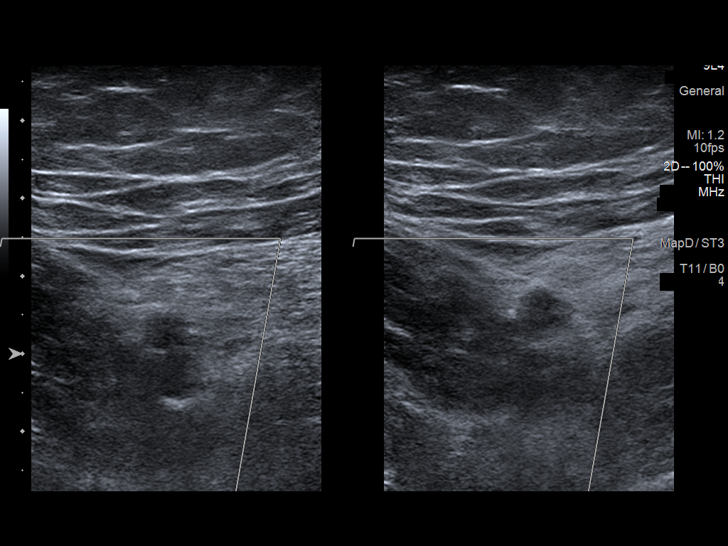
[im 15/42]
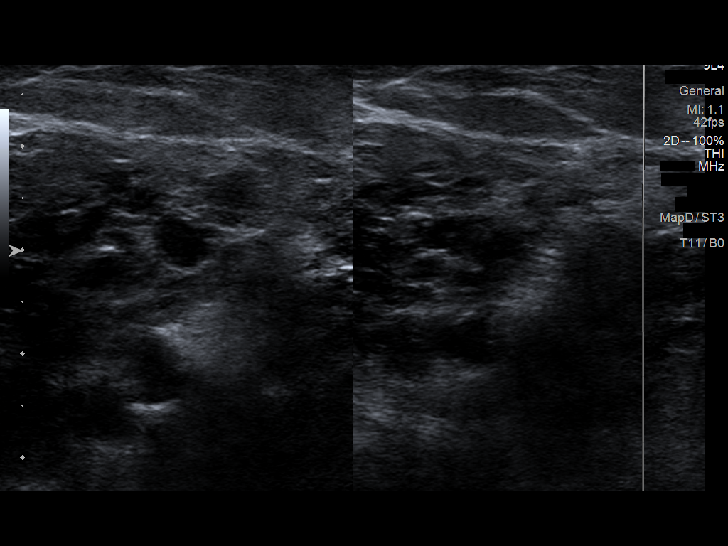
[im 18/42]
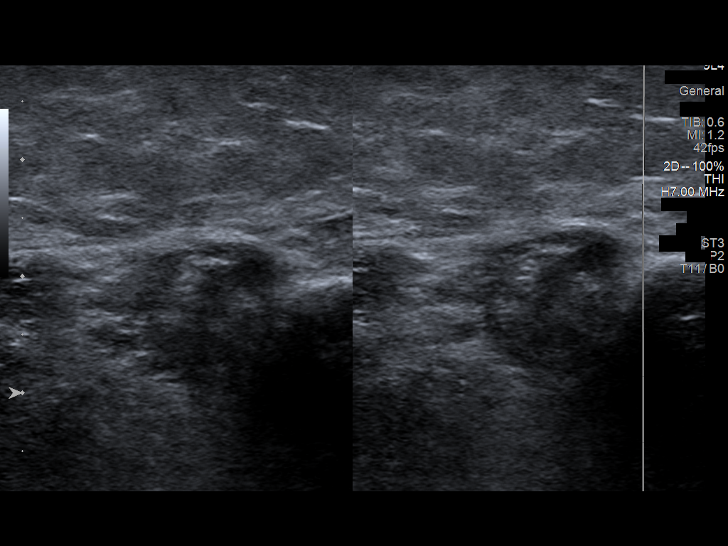
[im 22/42]
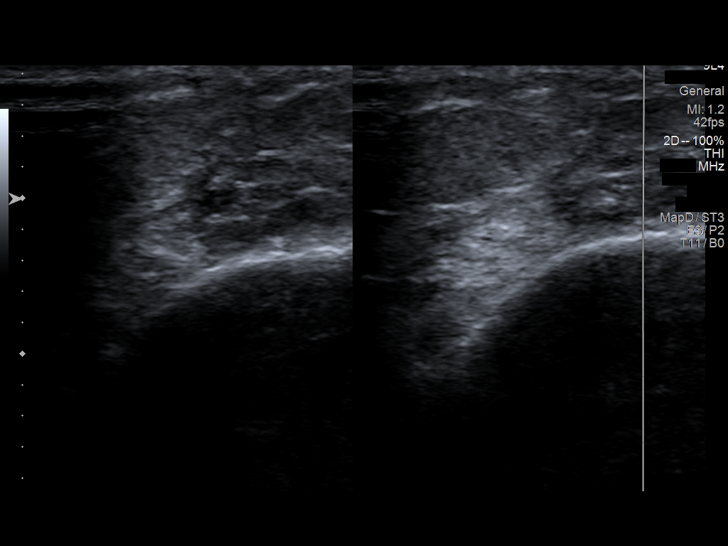
[im 24/42]
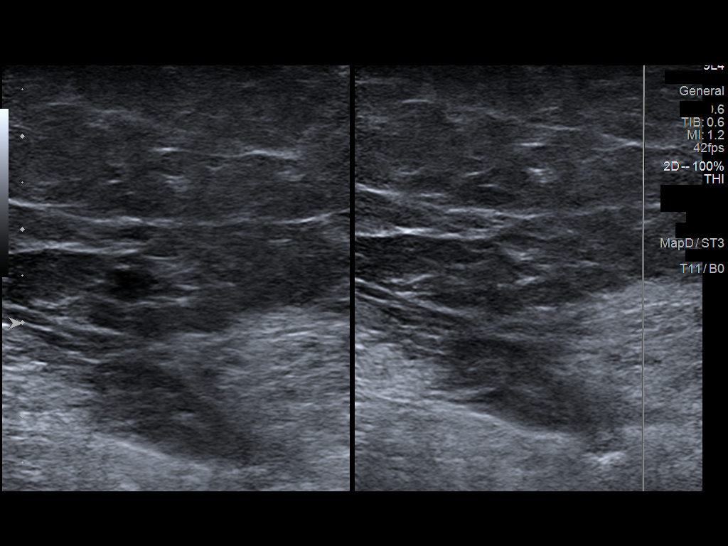
[im 27/42]
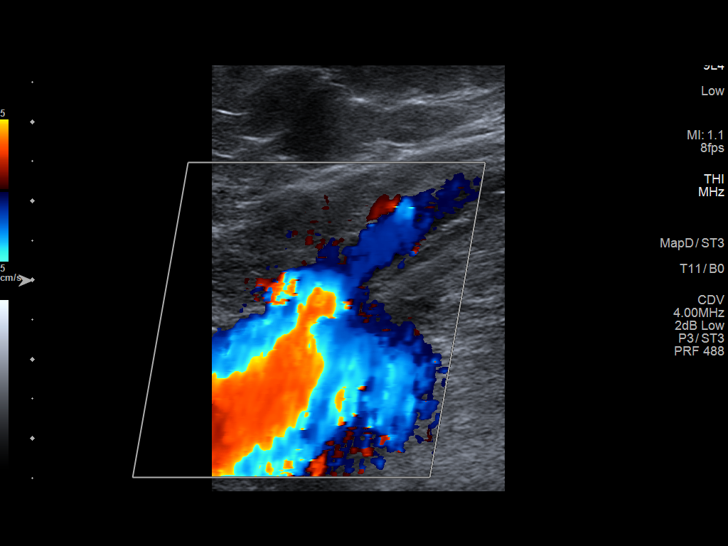
[im 31/42]
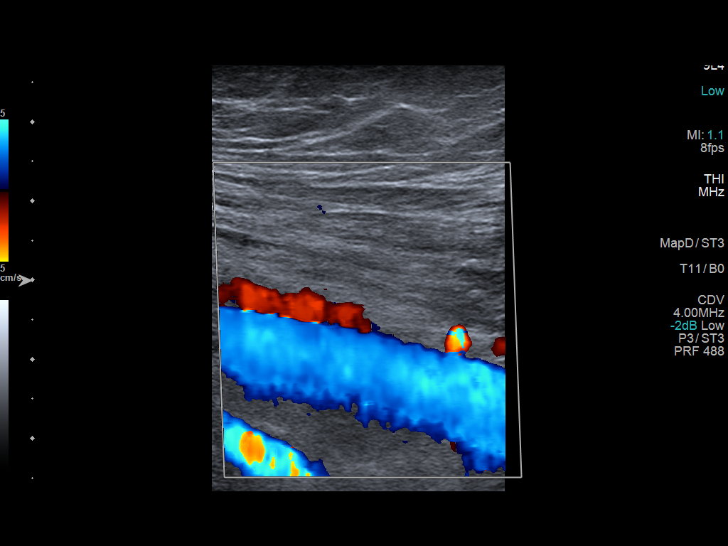
[im 34/42]
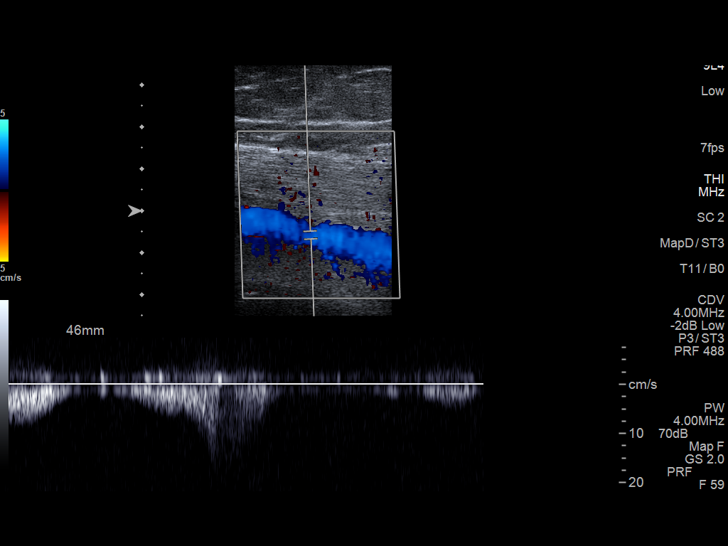
[im 38/42]
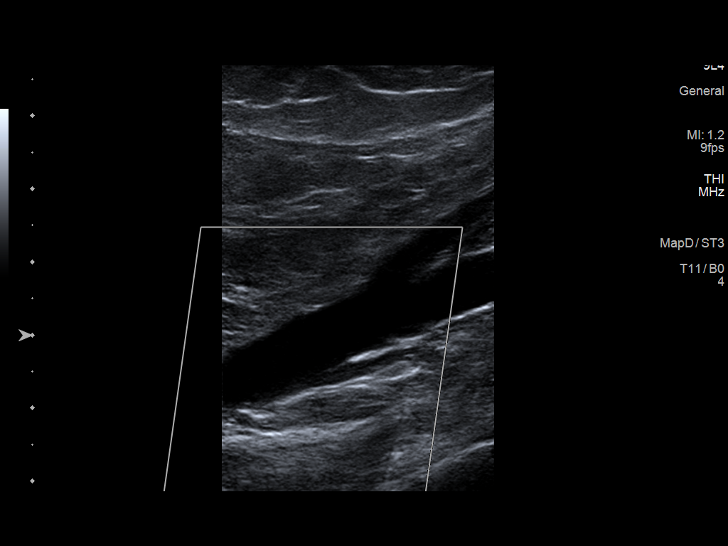
[im 42/42]
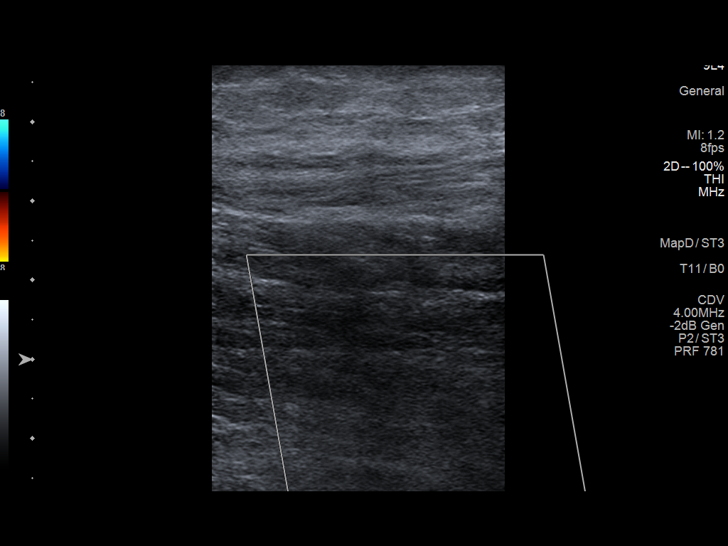

[13 of 24 positions shown; findings below may reference images not displayed]

FINDINGS: VENOUS

Normal compressibility of the common femoral, superficial femoral,
and popliteal veins, as well as the visualized calf veins.
Visualized portions of profunda femoral vein and great saphenous
vein unremarkable. No filling defects to suggest DVT on grayscale or
color Doppler imaging. Doppler waveforms show normal direction of
venous flow, normal respiratory phasicity and response to
augmentation.

Limited views of the contralateral common femoral vein are
unremarkable.

OTHER

None.

Limitations: none
IMPRESSION: No femoropopliteal DVT nor evidence of DVT within the visualized
calf veins in the left lower extremity.

If clinical symptoms are inconsistent or if there are persistent or
worsening symptoms, further imaging (possibly involving the iliac
veins) may be warranted.

## 2022-07-19 ENCOUNTER — Emergency Department (HOSPITAL_COMMUNITY)
Admission: EM | Admit: 2022-07-19 | Discharge: 2022-07-19 | Disposition: A | Discharge status: Home / Self Care | Payer: No Typology Code available for payment source | Attending: Emergency Medicine | Admitting: Emergency Medicine

## 2022-07-19 ENCOUNTER — Emergency Department (HOSPITAL_COMMUNITY): Payer: No Typology Code available for payment source

## 2022-07-19 ENCOUNTER — Other Ambulatory Visit: Payer: Self-pay

## 2022-07-19 ENCOUNTER — Encounter (HOSPITAL_COMMUNITY): Payer: Self-pay | Admitting: Radiology

## 2022-07-19 DIAGNOSIS — Y9301 Activity, walking, marching and hiking: Secondary | ICD-10-CM | POA: Insufficient documentation

## 2022-07-19 DIAGNOSIS — M79671 Pain in right foot: Secondary | ICD-10-CM | POA: Diagnosis not present

## 2022-07-19 DIAGNOSIS — S9305XA Dislocation of left ankle joint, initial encounter: Secondary | ICD-10-CM | POA: Diagnosis not present

## 2022-07-19 DIAGNOSIS — W108XXA Fall (on) (from) other stairs and steps, initial encounter: Secondary | ICD-10-CM | POA: Insufficient documentation

## 2022-07-19 DIAGNOSIS — W19XXXA Unspecified fall, initial encounter: Secondary | ICD-10-CM

## 2022-07-19 DIAGNOSIS — S99912A Unspecified injury of left ankle, initial encounter: Secondary | ICD-10-CM | POA: Diagnosis present

## 2022-07-19 DIAGNOSIS — S82892A Other fracture of left lower leg, initial encounter for closed fracture: Secondary | ICD-10-CM

## 2022-07-19 DIAGNOSIS — Z23 Encounter for immunization: Secondary | ICD-10-CM | POA: Insufficient documentation

## 2022-07-19 LAB — CBC WITH DIFFERENTIAL/PLATELET
Abs Immature Granulocytes: 0.03 10*3/uL (ref 0.00–0.07)
Basophils Absolute: 0.1 10*3/uL (ref 0.0–0.1)
Basophils Relative: 1 %
Eosinophils Absolute: 0.2 10*3/uL (ref 0.0–0.5)
Eosinophils Relative: 2 %
HCT: 42 % (ref 36.0–46.0)
Hemoglobin: 14 g/dL (ref 12.0–15.0)
Immature Granulocytes: 0 %
Lymphocytes Relative: 18 %
Lymphs Abs: 1.8 10*3/uL (ref 0.7–4.0)
MCH: 29.4 pg (ref 26.0–34.0)
MCHC: 33.3 g/dL (ref 30.0–36.0)
MCV: 88.2 fL (ref 80.0–100.0)
Monocytes Absolute: 0.7 10*3/uL (ref 0.1–1.0)
Monocytes Relative: 7 %
Neutro Abs: 7.4 10*3/uL (ref 1.7–7.7)
Neutrophils Relative %: 72 %
Platelets: 159 10*3/uL (ref 150–400)
RBC: 4.76 MIL/uL (ref 3.87–5.11)
RDW: 12.5 % (ref 11.5–15.5)
WBC: 10.2 10*3/uL (ref 4.0–10.5)
nRBC: 0 % (ref 0.0–0.2)

## 2022-07-19 LAB — BASIC METABOLIC PANEL
Anion gap: 10 (ref 5–15)
BUN: 11 mg/dL (ref 6–20)
CO2: 20 mmol/L — ABNORMAL LOW (ref 22–32)
Calcium: 9 mg/dL (ref 8.9–10.3)
Chloride: 106 mmol/L (ref 98–111)
Creatinine, Ser: 0.76 mg/dL (ref 0.44–1.00)
GFR, Estimated: >60 mL/min (ref >60)
Glucose, Bld: 139 mg/dL — ABNORMAL HIGH (ref 70–99)
Potassium: 4 mmol/L (ref 3.5–5.1)
Sodium: 136 mmol/L (ref 135–145)

## 2022-07-19 MED ORDER — OXYCODONE-ACETAMINOPHEN 5-325 MG PO TABS: 2.0000 | ORAL_TABLET | ORAL | Status: DC | PRN

## 2022-07-19 MED ORDER — ONDANSETRON 4 MG PO TBDP: 4.0000 mg | ORAL_TABLET | Freq: Three times a day (TID) | ORAL | 0 refills | Status: AC | PRN

## 2022-07-19 MED ORDER — METOCLOPRAMIDE HCL 5 MG/ML IJ SOLN
10.0000 mg | Freq: Once | INTRAMUSCULAR | Status: AC
  Administered 2022-07-19: 10 mg via INTRAVENOUS
  Filled 2022-07-19: qty 2

## 2022-07-19 MED ORDER — ONDANSETRON HCL 4 MG/2ML IJ SOLN
4.0000 mg | Freq: Once | INTRAMUSCULAR | Status: AC
  Administered 2022-07-19: 4 mg via INTRAVENOUS
  Filled 2022-07-19: qty 2

## 2022-07-19 MED ORDER — KETOROLAC TROMETHAMINE 15 MG/ML IJ SOLN
15.0000 mg | Freq: Once | INTRAMUSCULAR | Status: AC
  Administered 2022-07-19: 15 mg via INTRAVENOUS
  Filled 2022-07-19: qty 1

## 2022-07-19 MED ORDER — LACTATED RINGERS IV BOLUS
500.0000 mL | Freq: Once | INTRAVENOUS | Status: AC
  Administered 2022-07-19: 500 mL via INTRAVENOUS

## 2022-07-19 MED ORDER — HYDROMORPHONE HCL 1 MG/ML IJ SOLN
1.0000 mg | Freq: Once | INTRAMUSCULAR | Status: AC
  Administered 2022-07-19: 1 mg via INTRAVENOUS
  Filled 2022-07-19: qty 1

## 2022-07-19 MED ORDER — TETANUS-DIPHTH-ACELL PERTUSSIS 5-2.5-18.5 LF-MCG/0.5 IM SUSY
0.5000 mL | PREFILLED_SYRINGE | Freq: Once | INTRAMUSCULAR | Status: AC
  Administered 2022-07-19: 0.5 mL via INTRAMUSCULAR
  Filled 2022-07-19: qty 0.5

## 2022-07-19 MED ORDER — DIPHENHYDRAMINE HCL 50 MG/ML IJ SOLN
12.5000 mg | Freq: Once | INTRAMUSCULAR | Status: AC
  Administered 2022-07-19: 12.5 mg via INTRAVENOUS
  Filled 2022-07-19: qty 1

## 2022-07-19 MED ORDER — PROCHLORPERAZINE EDISYLATE 10 MG/2ML IJ SOLN
5.0000 mg | Freq: Once | INTRAMUSCULAR | Status: AC
  Administered 2022-07-19: 5 mg via INTRAVENOUS
  Filled 2022-07-19: qty 2

## 2022-07-19 MED ORDER — HYDROMORPHONE HCL 1 MG/ML IJ SOLN
0.5000 mg | Freq: Once | INTRAMUSCULAR | Status: AC
  Administered 2022-07-19: 0.5 mg via INTRAVENOUS
  Filled 2022-07-19: qty 1

## 2022-07-19 MED ORDER — ETOMIDATE 2 MG/ML IV SOLN
0.1000 mg/kg | Freq: Once | INTRAVENOUS | Status: AC
  Administered 2022-07-19: 9.3 mg via INTRAVENOUS
  Filled 2022-07-19: qty 10

## 2022-07-19 MED ORDER — TRAMADOL HCL 50 MG PO TABS
100.0000 mg | ORAL_TABLET | Freq: Once | ORAL | Status: AC
  Administered 2022-07-19: 100 mg via ORAL
  Filled 2022-07-19: qty 2

## 2022-07-19 MED ORDER — HYDROMORPHONE HCL 1 MG/ML IJ SOLN: 0.5000 mg | Freq: Once | INTRAMUSCULAR | Status: DC

## 2022-07-19 MED ORDER — OXYCODONE-ACETAMINOPHEN 5-325 MG PO TABS: 1.0000 | ORAL_TABLET | Freq: Four times a day (QID) | ORAL | 0 refills | Status: AC | PRN

## 2022-07-19 MED ORDER — KETAMINE HCL 50 MG/5ML IJ SOSY
20.0000 mg | PREFILLED_SYRINGE | Freq: Once | INTRAMUSCULAR | Status: AC
  Administered 2022-07-19: 20 mg via INTRAVENOUS
  Filled 2022-07-19: qty 5

## 2022-07-19 NOTE — ED Provider Notes (Addendum)
[place] EMERGENCY DEPARTMENT AT [facility] Provider Note   CSN: [id number] Arrival date & time: 07/19/22  0818     History  Chief Complaint  Patient presents with   Fall   bilateral ankle pain    Katherine Harrison is a 57 y.o. female.   Fall  Patient presents after a fall.  Fall was mechanical when she lost her balance while walking downstairs.  She was near the bottom of the stairs and fell down approximately 2 steps.  During this fall, she had rolling mechanism injuries to both ankles.  She has since had severe pain in her left ankle.  She also endorses pain in her right foot.  She did recheck to catch self with left hand.  She denies any significant pain to left hand or wrist.  She denies striking her head.  Patient is not on any blood thinners.  She has not eaten today.  She arrives via EMS.  No pain medication was given prior to arrival.     Home Medications Prior to Admission medications   Medication Sig Start Date End Date Taking? Authorizing Provider  ondansetron (ZOFRAN-ODT) 4 MG disintegrating tablet Take 1 tablet (4 mg total) by mouth every 8 (eight) hours as needed for nausea or vomiting. 07/19/22  Yes Gloris Manchester, MD  oxyCODONE-acetaminophen (PERCOCET/ROXICET) 5-325 MG tablet Take 1 tablet by mouth every 6 (six) hours as needed for up to 5 days for severe pain. 07/19/22 07/24/22 Yes Gloris Manchester, MD  ARMOUR THYROID 60 MG tablet Take 120 mg by mouth daily before breakfast.  07/18/14   [provider]  cetirizine (ZYRTEC) 10 MG tablet Take 10 mg by mouth daily.    [provider]  HYDROmorphone (DILAUDID) 2 MG tablet Take 1 tablet (2 mg total) by mouth every 2 (two) hours as needed for severe pain. 03/05/15   Levi Aland, MD      Allergies    Hydrocodone and Oxycodone    Review of Systems   Review of Systems  Musculoskeletal:  Positive for arthralgias.  All other systems reviewed and are negative.   Physical Exam Updated Vital  Signs BP (!) 154/56   Pulse 75   Temp 98.3 F (36.8 C) (Oral)   Resp 19   Wt 93 kg   SpO2 99%   BMI 35.19 kg/m  Physical Exam Vitals and nursing note reviewed.  Constitutional:      General: She is in acute distress.     Appearance: Normal appearance. She is well-developed. She is not ill-appearing, toxic-appearing or diaphoretic.  HENT:     Head: Normocephalic and atraumatic.     Right Ear: External ear normal.     Left Ear: External ear normal.     Nose: Nose normal.     Mouth/Throat:     Mouth: Mucous membranes are moist.  Eyes:     Extraocular Movements: Extraocular movements intact.     Conjunctiva/sclera: Conjunctivae normal.  Cardiovascular:     Rate and Rhythm: Normal rate and regular rhythm.  Pulmonary:     Effort: Pulmonary effort is normal. No respiratory distress.  Chest:     Chest wall: No tenderness.  Abdominal:     General: There is no distension.     Palpations: Abdomen is soft.     Tenderness: There is no abdominal tenderness.  Musculoskeletal:        General: Swelling, tenderness, deformity and signs of injury present.     Cervical  back: Normal range of motion and neck supple.  Skin:    General: Skin is warm and dry.     Findings: Bruising present.  Neurological:     General: No focal deficit present.     Mental Status: She is alert and oriented to person, place, and time.     Cranial Nerves: No cranial nerve deficit.     Sensory: No sensory deficit.     Motor: No weakness.     Coordination: Coordination normal.  Psychiatric:        Mood and Affect: Mood normal.        Behavior: Behavior normal.        Thought Content: Thought content normal.        Judgment: Judgment normal.        ED Results / Procedures / Treatments   Labs (all labs ordered are listed, but only abnormal results are displayed) Labs Reviewed  BASIC METABOLIC PANEL - Abnormal; Notable for the following components:      Result Value   CO2 20 (*)    Glucose, Bld 139  (*)    All other components within normal limits  CBC WITH DIFFERENTIAL/PLATELET    EKG None  Radiology CT Ankle Left Wo Contrast  Result Date: 07/19/2022 CLINICAL DATA:  Complex ankle fractures. EXAM: CT OF THE LEFT ANKLE WITHOUT CONTRAST TECHNIQUE: Multidetector CT imaging of the left ankle was performed according to the standard protocol. Multiplanar CT image reconstructions were also generated. RADIATION DOSE REDUCTION: This exam was performed according to the departmental dose-optimization program which includes automated exposure control, adjustment of the mA and/or kV according to patient size and/or use of iterative reconstruction technique. COMPARISON:  Radiographs, same date. FINDINGS: Complex comminuted ankle fractures as demonstrated on the radiographs. There is an oblique fracture through the base of the medial malleolus with mild impression of approximately 3.5 mm. Comminuted fracture involving the posterior malleolus of the tibia with articular step-off measuring approximately 3 mm and 3 mm of depression at the articular surface. There is also an avulsion fracture from the distal tip of the medial malleolus and associated medial mortise widening. Severely comminuted and complex fracture involving the entire distal shaft of the fibula including the lateral malleolus. Multiple butterfly type displaced fragments are noted. The talus is intact. Few tiny fracture fragments are noted in the tibiotalar joint. The subtalar joints are maintained. The sinus tarsi is unremarkable. No visualized midfoot or hindfoot fractures. IMPRESSION: 1. Complex comminuted and displaced intra-articular trimalleolar ankle fractures as described above. 2. Medial mortise widening. 3. No visualized midfoot or hindfoot fractures. Electronically Signed   By: Rudie Meyer M.D.   On: 07/19/2022 10:34   DG Ankle 2 Views Right  Result Date: 07/19/2022 CLINICAL DATA:  Fall EXAM: RIGHT ANKLE - 2 VIEW COMPARISON:  None  Available. FINDINGS: There is no evidence of fracture, dislocation, or joint effusion. There is no evidence of arthropathy or other focal bone abnormality. Soft tissues swelling about the ankle and foot. IMPRESSION: 1. No acute fracture or dislocation. 2. Soft tissue swelling about the ankle and foot. Electronically Signed   By: Larose Hires D.O.   On: 07/19/2022 10:04   DG Pelvis Portable  Result Date: 07/19/2022 CLINICAL DATA:  Fall down 2 steps. EXAM: PORTABLE PELVIS 1-2 VIEWS COMPARISON:  None Available. FINDINGS: Single frontal view of the pelvis. Mild-to-moderate bilateral femoroacetabular joint space narrowing. Within the limitations of mild obliquity of the bilateral femoral head-neck junctions, no definite proximal femoral  acute fracture is seen. Mild-to-moderate bilateral inferior sacroiliac joint space narrowing and subchondral sclerosis. Mild pubic symphysis joint space narrowing. No acute fracture is seen. 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 No acute fracture is seen. Electronically Signed   By: Neita Garnet M.D.   On: 07/19/2022 09:26   DG Ankle 2 Views Left  Result Date: 07/19/2022 CLINICAL DATA:  Fall.  Rolled both ankles.  Landed on both ankles. EXAM: LEFT ANKLE - 2 VIEW; LEFT FOOT - 2 VIEW COMPARISON:  Left ankle and foot radiographs 03/24/2019 and 08/27/2010 FINDINGS: Left ankle: There is a markedly comminuted fracture of the distal fibular diaphysis and metaphysis along an approximate 6.5 cm craniocaudal length with moderate fracture line diastasis, mild-to-moderate lateral apex angulation, and moderate anterior apex angulation of the fracture. There is posterior dislocation of the talar dome with respect to the distal tibial plafond. There is an associated vertical oriented fracture of the posterior malleolus with approximately 2 cm posterior displacement of the posterior fracture component and also superior displacement of the posterior fracture  component, resting on the posterior aspect of the talar dome on lateral view. There is an acute fracture of the superior aspect of the medial malleolus with approximately 9 mm medial displacement of the distal fracture component with respect to the proximal fracture component. Moderate calcaneocuboid joint space narrowing and peripheral osteophytosis. Bone-on-bone contact of the talonavicular joint on lateral view. Tiny plantar calcaneal heel spur. Multiple small chronic ossicles at the Achilles insertion on the calcaneus. -- Left foot: Moderate lateral and mild medial great toe metatarsophalangeal joint space narrowing with moderate lateral degenerative osteophytosis. Moderate to severe second through fifth interphalangeal joint space narrowing. IMPRESSION: 1. Acute markedly comminuted fracture of the distal fibular diaphysis and metaphysis with moderate fracture line diastasis, mild-to-moderate lateral apex angulation, and moderate anterior apex angulation of the fracture. 2. Acute displaced fracture of the posterior malleolus with associated posterior dislocation of the talar dome with respect to the distal tibial plafond. 3. Acute displaced fracture of the superior aspect of the medial malleolus. Electronically Signed   By: Neita Garnet M.D.   On: 07/19/2022 09:25   DG Foot 2 Views Left  Result Date: 07/19/2022 CLINICAL DATA:  Fall.  Rolled both ankles.  Landed on both ankles. EXAM: LEFT ANKLE - 2 VIEW; LEFT FOOT - 2 VIEW COMPARISON:  Left ankle and foot radiographs 03/24/2019 and 08/27/2010 FINDINGS: Left ankle: There is a markedly comminuted fracture of the distal fibular diaphysis and metaphysis along an approximate 6.5 cm craniocaudal length with moderate fracture line diastasis, mild-to-moderate lateral apex angulation, and moderate anterior apex angulation of the fracture. There is posterior dislocation of the talar dome with respect to the distal tibial plafond. There is an associated vertical  oriented fracture of the posterior malleolus with approximately 2 cm posterior displacement of the posterior fracture component and also superior displacement of the posterior fracture component, resting on the posterior aspect of the talar dome on lateral view. There is an acute fracture of the superior aspect of the medial malleolus with approximately 9 mm medial displacement of the distal fracture component with respect to the proximal fracture component. Moderate calcaneocuboid joint space narrowing and peripheral osteophytosis. Bone-on-bone contact of the talonavicular joint on lateral view. Tiny plantar calcaneal heel spur. Multiple small chronic ossicles at the Achilles insertion on the calcaneus. -- Left foot: Moderate lateral and mild medial great toe metatarsophalangeal joint space narrowing with moderate lateral degenerative osteophytosis. Moderate to severe second through fifth interphalangeal joint  space narrowing. IMPRESSION: 1. Acute markedly comminuted fracture of the distal fibular diaphysis and metaphysis with moderate fracture line diastasis, mild-to-moderate lateral apex angulation, and moderate anterior apex angulation of the fracture. 2. Acute displaced fracture of the posterior malleolus with associated posterior dislocation of the talar dome with respect to the distal tibial plafond. 3. Acute displaced fracture of the superior aspect of the medial malleolus. Electronically Signed   By: Neita Garnet M.D.   On: 07/19/2022 09:25   DG Chest Portable 1 View  Result Date: 07/19/2022 CLINICAL DATA:  Fall. EXAM: PORTABLE CHEST 1 VIEW COMPARISON:  None Available. FINDINGS: Cardiac silhouette and mediastinal contours are within limits. The lungs are clear. No pleural effusion or pneumothorax. No acute skeletal abnormality. IMPRESSION: No acute cardiopulmonary disease process. Electronically Signed   By: Neita Garnet M.D.   On: 07/19/2022 09:18   DG Foot 2 Views Right  Result Date:  07/19/2022 CLINICAL DATA:  Fall.  Missed a step and rolled both ankles.  Pain. EXAM: RIGHT FOOT - 2 VIEW COMPARISON:  None Available. FINDINGS: Mild-to-moderate great toe metatarsophalangeal joint space narrowing with mild lateral degenerative osteophytosis. Moderate to high-grade second and third and moderate fourth and fifth interphalangeal joint space narrowing. Mild lateral great toe interphalangeal joint space narrowing and peripheral degenerative spurring. Mild chronic enthesopathic change at the Achilles and plantar fascia insertions on the calcaneus. Moderate to severe talonavicular joint space narrowing on lateral view. There is a grouping of tiny mineralized foci overall measuring up to 3 mm just dorsal to the tarsometatarsal joints on lateral view. There is mild-to-moderate soft tissue swelling just dorsal to this region. IMPRESSION: There is a grouping of tiny mineralized foci overall measuring up to 3 mm just dorsal to the tarsometatarsal joints on lateral view. There is mild-to-moderate soft tissue swelling just dorsal to this region, and this is suspicious for an acute avulsion injury. Electronically Signed   By: Neita Garnet M.D.   On: 07/19/2022 09:17    Procedures .Sedation  Date/Time: 07/19/2022 9:17 AM  Performed by: Gloris Manchester, MD Authorized by: Gloris Manchester, MD   Consent:    Consent obtained:  Verbal   Consent given by:  Patient   Risks discussed:  Nausea, vomiting, inadequate sedation, allergic reaction and respiratory compromise necessitating ventilatory assistance and intubation Universal protocol:    Immediately prior to procedure, a time out was called: yes     Patient identity confirmed:  Verbally with patient Indications:    Procedure performed:  Dislocation reduction   Procedure necessitating sedation performed by:  Physician performing sedation Pre-sedation assessment:    Time since last food or drink:  12 hours   ASA classification: class 2 - patient with mild  systemic disease     Mouth opening:  2 finger widths   Thyromental distance:  3 finger widths   Mallampati score:  II - soft palate, uvula, fauces visible   Neck mobility: normal     Pre-sedation assessments completed and reviewed: airway patency, cardiovascular function, hydration status, mental status, nausea/vomiting, pain level and respiratory function     Pre-sedation assessment completed:  07/19/2022 9:07 AM Immediate pre-procedure details:    Reassessment: Patient reassessed immediately prior to procedure     Reviewed: vital signs     Verified: bag valve mask available, emergency equipment available, intubation equipment available, IV patency confirmed and oxygen available   Procedure details (see MAR for exact dosages):    Preoxygenation:  Nasal cannula   Sedation:  Etomidate  Intended level of sedation: moderate (conscious sedation)   Analgesia:  Hydromorphone   Intra-procedure monitoring:  Blood pressure monitoring, cardiac monitor, continuous pulse oximetry, continuous capnometry, frequent LOC assessments and frequent vital sign checks   Intra-procedure events: none     Total Provider sedation time (minutes):  10 Post-procedure details:    Post-sedation assessment completed:  07/19/2022 9:18 AM   Attendance: Constant attendance by certified staff until patient recovered     Recovery: Patient returned to pre-procedure baseline     Post-sedation assessments completed and reviewed: airway patency, cardiovascular function, hydration status, mental status, nausea/vomiting, pain level and respiratory function     Patient is stable for discharge or admission: yes     Procedure completion:  Tolerated well, no immediate complications .Ortho Injury Treatment  Date/Time: 07/19/2022 9:18 AM  Performed by: Gloris Manchester, MD Authorized by: Gloris Manchester, MD   Consent:    Consent obtained:  Verbal   Consent given by:  Patient   Risks discussed:  Irreducible dislocation, vascular damage and  recurrent dislocation   Alternatives discussed:  No treatment and delayed treatmentInjury location: ankle Location details: left ankle Injury type: fracture-dislocation Pre-procedure neurovascular assessment: neurovascularly intact Pre-procedure distal perfusion: normal Pre-procedure neurological function: normal Pre-procedure range of motion: reduced  Anesthesia: Local anesthesia used: no  Patient sedated: Yes. Refer to sedation procedure documentation for details of sedation. Manipulation performed: yes Skin traction used: yes Reduction successful: yes Immobilization: splint Splint type: ankle stirrup Splint Applied by: Milon Dikes Post-procedure neurovascular assessment: post-procedure neurovascularly intact Post-procedure distal perfusion: normal Post-procedure neurological function: normal       Medications Ordered in ED Medications  HYDROmorphone (DILAUDID) injection 1 mg (1 mg Intravenous Given 07/19/22 0841)  ondansetron (ZOFRAN) injection 4 mg (4 mg Intravenous Given 07/19/22 0841)  ketorolac (TORADOL) 15 MG/ML injection 15 mg (15 mg Intravenous Given 07/19/22 0841)  lactated ringers bolus 500 mL (0 mLs Intravenous Stopped 07/19/22 1000)  Tdap (BOOSTRIX) injection 0.5 mL (0.5 mLs Intramuscular Given 07/19/22 0920)  etomidate (AMIDATE) injection 9.3 mg (9.3 mg Intravenous Given by Other 07/19/22 0912)  HYDROmorphone (DILAUDID) injection 0.5 mg (0.5 mg Intravenous Given 07/19/22 1002)  metoCLOPramide (REGLAN) injection 10 mg (10 mg Intravenous Given 07/19/22 1018)  ketamine 50 mg in normal saline 5 mL (10 mg/mL) syringe (20 mg Intravenous Given 07/19/22 1021)    ED Course/ Medical Decision Making/ A&P                             Medical Decision Making Amount and/or Complexity of Data Reviewed Labs: ordered. Radiology: ordered.  Risk Prescription drug management.   This patient presents to the ED for concern of fall, this involves an extensive number of treatment  options, and is a complaint that carries with it a high risk of complications and morbidity.  The differential diagnosis includes acute injuries   Co morbidities that complicate the patient evaluation  Arthritis   Additional history obtained:  Additional history obtained from EMS External records from outside source obtained and reviewed including EMR   Lab Tests:  I Ordered, and personally interpreted labs.  The pertinent results include: Normal hemoglobin, no leukocytosis, normal kidney function, normal electrolytes   Imaging Studies ordered:  I ordered imaging studies including x-ray imaging of chest, pelvis, bilateral ankles, bilateral feet; postreduction CT scan of left ankle I independently visualized and interpreted imaging which showed complex comminuted and displaced intra-articular trimalar fracture of left ankle; possible  avulsion injury of right foot tarsometatarsal joints I agree with the radiologist interpretation   Cardiac Monitoring: / EKG:  The patient was maintained on a cardiac monitor.  I personally viewed and interpreted the cardiac monitored which showed an underlying rhythm of: Sinus rhythm   Consultations Obtained:  I requested consultation with the orthopedic surgeon, Dr. Eulah Pont,  and discussed lab and imaging findings as well as pertinent plan - they recommend: Immediate follow-up in his office today   Problem List / ED Course / Critical interventions / Medication management  Patient presents after mechanical fall.  This occurred shortly prior to arrival.  Mechanism was described as walking up steps and losing her balance causing her to have rolling injuries to both ankles.  On arrival, patient appears quite uncomfortable.  She endorses severe pain in her left ankle, which does not appear to have deformity.  There is a small abrasion/break in skin to the anterior aspect, raising concern of possible open fracture.  Dorsum of right foot has bruising and  swelling.  There is no pain or tenderness in lower extremities proximal to ankles.  She has good range of motion in upper extremities without any evidence of traumatic injury.  She denies striking her head.  Dilaudid and Toradol were ordered for analgesia.  X-ray imaging was ordered to assess for osseous injuries.  Tetanus was updated.  X-ray imaging confirms fracture dislocation of left ankle.  Patient underwent sedation and bedside reduction.  Splint was placed.  CT scan was ordered.  Given concern possible open fracture, case was discussed with orthopedic surgery.  Dr. Eulah Pont reviewed x-ray imaging as well as postreduction CT scan.  He recommends immediate office follow-up after discharge in the ED.  Patient had ongoing pain and nausea following reduction and CT scan.  Reglan and pain dose ketamine were ordered.  Pain and nausea were subsequently controlled.  Patient has family at bedside who will help her get to orthopedic office.  She was discharged in stable condition.  At time of discharge, patient had worsened pain and was unable to get into her son's vehicle.  She was brought back into the ED.  Continued medications were given for pain and nausea.  I spoke with orthopedic surgery again.  PA Earney Hamburg came and evaluated her.  Plan remains to discharge if pain is able to be controlled.  If not, patient to be admitted for pain management.  Patient's symptoms were ultimately able to get under control and she was discharged in stable condition. I ordered medication including Dilaudid, Toradol, ketamine for dizziness; Tdap for tetanus prophylaxis; IV fluids for hydration; Reglan and Zofran for nausea; etomidate for procedural sedation Reevaluation of the patient after these medicines showed that the patient improved I have reviewed the patients home medicines and have made adjustments as needed   Social Determinants of Health:  Lives independently         Final Clinical Impression(s) / ED  Diagnoses Final diagnoses:  Closed fracture dislocation of left ankle, initial encounter  Fall, initial encounter    Rx / DC Orders ED Discharge Orders          Ordered    oxyCODONE-acetaminophen (PERCOCET/ROXICET) 5-325 MG tablet  Every 6 hours PRN        07/19/22 1018    ondansetron (ZOFRAN-ODT) 4 MG disintegrating tablet  Every 8 hours PRN        07/19/22 1020  Gloris Manchester, MD 07/19/22 1104    Gloris Manchester, MD 07/19/22 [phone]

## 2022-07-19 NOTE — Consult Note (Signed)
Reason for Consult:Left ankle fx Referring Physician: Gloris Manchester Time called: 1153 Time at bedside: 1216   Katherine Harrison is an 57 y.o. female.  HPI: Leila fell while going up some stairs at home. She had immediate ankle pain and could not get up. She was brought to the ED where x-rays showed bilateral ankle fxs, L>>R. She was reduced and plan was to d/c home for OP f/u but could not tolerate pain meds nor pain.  Past Medical History:  Diagnosis Date   Claudication (HCC)    Environmental allergies    Headache    migraine, visual aura-take aleve immediately to avoid migraines   Hypothyroidism    Muscle cramps    bilateral legs   OA (osteoarthritis)    PONV (postoperative nausea and vomiting)     Past Surgical History:  Procedure Laterality Date   BLADDER SUSPENSION N/A 03/04/2015   Procedure: TRANSVAGINAL TAPE (TVT) PROCEDURE;  Surgeon: Levi Aland, MD;  Location: [facility];  Service: Gynecology;  Laterality: N/A;   CYSTOSCOPY N/A 03/04/2015   Procedure: CYSTOSCOPY;  Surgeon: Levi Aland, MD;  Location: [facility];  Service: Gynecology;  Laterality: N/A;   DILITATION & CURRETTAGE/HYSTROSCOPY WITH HYDROTHERMAL ABLATION N/A 03/04/2015   Procedure: DILATATION & CURETTAGE/HYSTEROSCOPY WITH HYDROTHERMAL ABLATION;  Surgeon: Levi Aland, MD;  Location: [facility];  Service: Gynecology;  Laterality: N/A;   EYE SURGERY     eye muscle shortening surgery as toddler   TONSILLECTOMY     TUBAL LIGATION     WISDOM TOOTH EXTRACTION      No family history on file.  Social History:  reports that she quit smoking about 24 years ago. Her smoking use included cigarettes. She has a 7.50 pack-year smoking history. She has never used smokeless tobacco. She reports current alcohol use. She reports that she does not use drugs.  Allergies:  Allergies  Allergen Reactions   Hydrocodone Nausea And Vomiting   Oxycodone Nausea And Vomiting    Medications: I have reviewed the patient's current  medications.  Results for orders placed or performed during the hospital encounter of 07/19/22 (from the past 48 hour(s))  CBC with Differential     Status: None   Collection Time: 07/19/22  8:33 AM  Result Value Ref Range   WBC 10.2 4.0 - 10.5 K/uL   RBC 4.76 3.87 - 5.11 MIL/uL   Hemoglobin 14.0 12.0 - 15.0 g/dL   HCT 18.8 41.6 - 60.6 %   MCV 88.2 80.0 - 100.0 fL   MCH 29.4 26.0 - 34.0 pg   MCHC 33.3 30.0 - 36.0 g/dL   RDW 30.1 60.1 - 09.3 %   Platelets 159 150 - 400 K/uL   nRBC 0.0 0.0 - 0.2 %   Neutrophils Relative % 72 %   Neutro Abs 7.4 1.7 - 7.7 K/uL   Lymphocytes Relative 18 %   Lymphs Abs 1.8 0.7 - 4.0 K/uL   Monocytes Relative 7 %   Monocytes Absolute 0.7 0.1 - 1.0 K/uL   Eosinophils Relative 2 %   Eosinophils Absolute 0.2 0.0 - 0.5 K/uL   Basophils Relative 1 %   Basophils Absolute 0.1 0.0 - 0.1 K/uL   Immature Granulocytes 0 %   Abs Immature Granulocytes 0.03 0.00 - 0.07 K/uL    Comment: Performed at [facility], [address], [place], [place] [address]  Basic metabolic panel     Status: Abnormal   Collection Time: 07/19/22  8:33 AM  Result Value Ref Range   Sodium 136 135 - 145 mmol/L   Potassium 4.0 3.5 - 5.1 mmol/L   Chloride 106 98 - 111 mmol/L   CO2 20 (L) 22 - 32 mmol/L   Glucose, Bld 139 (H) 70 - 99 mg/dL    Comment: Glucose reference range applies only to samples taken after fasting for at least 8 hours.   BUN 11 6 - 20 mg/dL   Creatinine, Ser 7.25 0.44 - 1.00 mg/dL   Calcium 9.0 8.9 - 36.6 mg/dL   GFR, Estimated >44 >03 mL/min    Comment: (NOTE) Calculated using the CKD-EPI Creatinine Equation (2021)    Anion gap 10 5 - 15    Comment: Performed at [facility], [address], [place], [place] [address]    CT Ankle Left Wo Contrast  Result Date: 07/19/2022 CLINICAL DATA:  Complex ankle fractures. EXAM: CT OF THE LEFT ANKLE WITHOUT CONTRAST TECHNIQUE: Multidetector CT imaging of the left ankle was performed  according to the standard protocol. Multiplanar CT image reconstructions were also generated. RADIATION DOSE REDUCTION: This exam was performed according to the departmental dose-optimization program which includes automated exposure control, adjustment of the mA and/or kV according to patient size and/or use of iterative reconstruction technique. COMPARISON:  Radiographs, same date. FINDINGS: Complex comminuted ankle fractures as demonstrated on the radiographs. There is an oblique fracture through the base of the medial malleolus with mild impression of approximately 3.5 mm. Comminuted fracture involving the posterior malleolus of the tibia with articular step-off measuring approximately 3 mm and 3 mm of depression at the articular surface. There is also an avulsion fracture from the distal tip of the medial malleolus and associated medial mortise widening. Severely comminuted and complex fracture involving the entire distal shaft of the fibula including the lateral malleolus. Multiple butterfly type displaced fragments are noted. The talus is intact. Few tiny fracture fragments are noted in the tibiotalar joint. The subtalar joints are maintained. The sinus tarsi is unremarkable. No visualized midfoot or hindfoot fractures. IMPRESSION: 1. Complex comminuted and displaced intra-articular trimalleolar ankle fractures as described above. 2. Medial mortise widening. 3. No visualized midfoot or hindfoot fractures. Electronically Signed   By: Rudie Meyer M.D.   On: 07/19/2022 10:34   DG Ankle 2 Views Right  Result Date: 07/19/2022 CLINICAL DATA:  Fall EXAM: RIGHT ANKLE - 2 VIEW COMPARISON:  None Available. FINDINGS: There is no evidence of fracture, dislocation, or joint effusion. There is no evidence of arthropathy or other focal bone abnormality. Soft tissues swelling about the ankle and foot. IMPRESSION: 1. No acute fracture or dislocation. 2. Soft tissue swelling about the ankle and foot. Electronically  Signed   By: Larose Hires D.O.   On: 07/19/2022 10:04   DG Pelvis Portable  Result Date: 07/19/2022 CLINICAL DATA:  Fall down 2 steps. EXAM: PORTABLE PELVIS 1-2 VIEWS COMPARISON:  None Available. FINDINGS: Single frontal view of the pelvis. Mild-to-moderate bilateral femoroacetabular joint space narrowing. Within the limitations of mild obliquity of the bilateral femoral head-neck junctions, no definite proximal femoral acute fracture is seen. Mild-to-moderate bilateral inferior sacroiliac joint space narrowing and subchondral sclerosis. Mild pubic symphysis joint space narrowing. No acute fracture is seen. 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 No acute fracture is seen. Electronically Signed   By: Neita Garnet M.D.   On: 07/19/2022 09:26   DG Ankle 2 Views Left  Result Date: 07/19/2022 CLINICAL DATA:  Fall.  Rolled both ankles.  Landed on  both ankles. EXAM: LEFT ANKLE - 2 VIEW; LEFT FOOT - 2 VIEW COMPARISON:  Left ankle and foot radiographs 03/24/2019 and 08/27/2010 FINDINGS: Left ankle: There is a markedly comminuted fracture of the distal fibular diaphysis and metaphysis along an approximate 6.5 cm craniocaudal length with moderate fracture line diastasis, mild-to-moderate lateral apex angulation, and moderate anterior apex angulation of the fracture. There is posterior dislocation of the talar dome with respect to the distal tibial plafond. There is an associated vertical oriented fracture of the posterior malleolus with approximately 2 cm posterior displacement of the posterior fracture component and also superior displacement of the posterior fracture component, resting on the posterior aspect of the talar dome on lateral view. There is an acute fracture of the superior aspect of the medial malleolus with approximately 9 mm medial displacement of the distal fracture component with respect to the proximal fracture component. Moderate calcaneocuboid joint space  narrowing and peripheral osteophytosis. Bone-on-bone contact of the talonavicular joint on lateral view. Tiny plantar calcaneal heel spur. Multiple small chronic ossicles at the Achilles insertion on the calcaneus. -- Left foot: Moderate lateral and mild medial great toe metatarsophalangeal joint space narrowing with moderate lateral degenerative osteophytosis. Moderate to severe second through fifth interphalangeal joint space narrowing. IMPRESSION: 1. Acute markedly comminuted fracture of the distal fibular diaphysis and metaphysis with moderate fracture line diastasis, mild-to-moderate lateral apex angulation, and moderate anterior apex angulation of the fracture. 2. Acute displaced fracture of the posterior malleolus with associated posterior dislocation of the talar dome with respect to the distal tibial plafond. 3. Acute displaced fracture of the superior aspect of the medial malleolus. Electronically Signed   By: Neita Garnet M.D.   On: 07/19/2022 09:25   DG Foot 2 Views Left  Result Date: 07/19/2022 CLINICAL DATA:  Fall.  Rolled both ankles.  Landed on both ankles. EXAM: LEFT ANKLE - 2 VIEW; LEFT FOOT - 2 VIEW COMPARISON:  Left ankle and foot radiographs 03/24/2019 and 08/27/2010 FINDINGS: Left ankle: There is a markedly comminuted fracture of the distal fibular diaphysis and metaphysis along an approximate 6.5 cm craniocaudal length with moderate fracture line diastasis, mild-to-moderate lateral apex angulation, and moderate anterior apex angulation of the fracture. There is posterior dislocation of the talar dome with respect to the distal tibial plafond. There is an associated vertical oriented fracture of the posterior malleolus with approximately 2 cm posterior displacement of the posterior fracture component and also superior displacement of the posterior fracture component, resting on the posterior aspect of the talar dome on lateral view. There is an acute fracture of the superior aspect of the  medial malleolus with approximately 9 mm medial displacement of the distal fracture component with respect to the proximal fracture component. Moderate calcaneocuboid joint space narrowing and peripheral osteophytosis. Bone-on-bone contact of the talonavicular joint on lateral view. Tiny plantar calcaneal heel spur. Multiple small chronic ossicles at the Achilles insertion on the calcaneus. -- Left foot: Moderate lateral and mild medial great toe metatarsophalangeal joint space narrowing with moderate lateral degenerative osteophytosis. Moderate to severe second through fifth interphalangeal joint space narrowing. IMPRESSION: 1. Acute markedly comminuted fracture of the distal fibular diaphysis and metaphysis with moderate fracture line diastasis, mild-to-moderate lateral apex angulation, and moderate anterior apex angulation of the fracture. 2. Acute displaced fracture of the posterior malleolus with associated posterior dislocation of the talar dome with respect to the distal tibial plafond. 3. Acute displaced fracture of the superior aspect of the medial malleolus. Electronically Signed  By: Neita Garnet M.D.   On: 07/19/2022 09:25   DG Chest Portable 1 View  Result Date: 07/19/2022 CLINICAL DATA:  Fall. EXAM: PORTABLE CHEST 1 VIEW COMPARISON:  None Available. FINDINGS: Cardiac silhouette and mediastinal contours are within limits. The lungs are clear. No pleural effusion or pneumothorax. No acute skeletal abnormality. IMPRESSION: No acute cardiopulmonary disease process. Electronically Signed   By: Neita Garnet M.D.   On: 07/19/2022 09:18   DG Foot 2 Views Right  Result Date: 07/19/2022 CLINICAL DATA:  Fall.  Missed a step and rolled both ankles.  Pain. EXAM: RIGHT FOOT - 2 VIEW COMPARISON:  None Available. FINDINGS: Mild-to-moderate great toe metatarsophalangeal joint space narrowing with mild lateral degenerative osteophytosis. Moderate to high-grade second and third and moderate fourth and fifth  interphalangeal joint space narrowing. Mild lateral great toe interphalangeal joint space narrowing and peripheral degenerative spurring. Mild chronic enthesopathic change at the Achilles and plantar fascia insertions on the calcaneus. Moderate to severe talonavicular joint space narrowing on lateral view. There is a grouping of tiny mineralized foci overall measuring up to 3 mm just dorsal to the tarsometatarsal joints on lateral view. There is mild-to-moderate soft tissue swelling just dorsal to this region. IMPRESSION: There is a grouping of tiny mineralized foci overall measuring up to 3 mm just dorsal to the tarsometatarsal joints on lateral view. There is mild-to-moderate soft tissue swelling just dorsal to this region, and this is suspicious for an acute avulsion injury. Electronically Signed   By: Neita Garnet M.D.   On: 07/19/2022 09:17    Review of Systems  HENT:  Negative for ear discharge, ear pain, hearing loss and tinnitus.   Eyes:  Negative for photophobia and pain.  Respiratory:  Negative for cough and shortness of breath.   Cardiovascular:  Negative for chest pain.  Gastrointestinal:  Negative for abdominal pain, nausea and vomiting.  Genitourinary:  Negative for dysuria, flank pain, frequency and urgency.  Musculoskeletal:  Positive for arthralgias (Bilateral ankles). Negative for back pain, myalgias and neck pain.  Neurological:  Negative for dizziness and headaches.  Hematological:  Does not bruise/bleed easily.  Psychiatric/Behavioral:  The patient is not nervous/anxious.    Blood pressure (!) 174/57, pulse 84, temperature 98.8 F (37.1 C), temperature source Oral, resp. rate 18, weight 93 kg, SpO2 94 %. Physical Exam Constitutional:      General: She is not in acute distress.    Appearance: She is well-developed. She is not diaphoretic.  HENT:     Head: Normocephalic and atraumatic.  Eyes:     General: No scleral icterus.       Right eye: No discharge.        Left eye:  No discharge.     Conjunctiva/sclera: Conjunctivae normal.  Cardiovascular:     Rate and Rhythm: Normal rate and regular rhythm.  Pulmonary:     Effort: Pulmonary effort is normal. No respiratory distress.  Musculoskeletal:     Cervical back: Normal range of motion.     Comments: LLE No traumatic wounds, ecchymosis, or rash  Short leg splint in place  No knee effusion  Knee stable to varus/ valgus and anterior/posterior stress  Sens DPN, SPN, TN intact  Motor EHL 5/5  Toes perfused, No significant edema  Skin:    General: Skin is warm and dry.  Neurological:     Mental Status: She is alert.  Psychiatric:        Mood and Affect: Mood normal.  Behavior: Behavior normal.    Assessment/Plan: Left ankle fx -- Will see if we can manage to get her pain controlled and discharged. Otherwise will need medical admit and likely delayed ORIF.    Freeman Caldron, PA-C Orthopedic Surgery [phone] 07/19/2022, 1:05 PM

## 2022-07-19 NOTE — Discharge Instructions (Signed)
For pain, take ibuprofen, Tylenol, and Percocet as needed.  Prescription for Percocet was sent to your pharmacy.  A prescription for Zofran was also sent to take as needed for nausea.  Keep weight off of left leg.  Go directly from the emergency department to Dr. Greig Right office.  Address is below.

## 2022-07-19 NOTE — ED Notes (Addendum)
0911 timeout complete. Consents signed. RT, orhto tech, MD, RN bedside. Pt on monitor with end tidal monitoring. 9562 Meds given via MD. 1308 Reduction performed. Splinting initiated. Pt maintaining airway. VSS.  0915 Splinting complete, pt awake, verbalizing discomfort 0917 Pt return to baseline RAAS. Is A&Ox4. Reports improved pain. Awaiting post reduction imaging.  9:37 AM pt to CT  1130 Pt assisted into wheelchair to get to car with RN supporting lt leg. Pt not tolerating any movement. Pt not able to tolerate any weight bearing on rt foot with boot. Pt yelling out in pain. Pt assisted back to ED 17. ED Dr. Eather Colas of pt condition and need for pain management.  1410 Pt vomited shortly after giving PO tramadol. ED and ortho providers aware.

## 2022-07-19 NOTE — Progress Notes (Signed)
Orthopedic Tech Progress Note Patient Details:  Katherine Harrison 27-Sep-1965 [id number]  Patient ID: Katherine Harrison, female   DOB: [birth year], 57 y.o.   MRN: [id number]  Katherine Harrison 07/19/2022, 10:28 AM Right cam boot. Crutches supplied. Left SLS x2. Reduction by DR.

## 2022-07-19 NOTE — ED Triage Notes (Signed)
Pt arrives from home via EMS for report of falling down x2 steps after missing her step causing her to roll both of her ankles. Pt states she "landed on both ankles," denies head injury, LOC, or blood thinners. Pulses, sensation wdl. No obvious deformity. Pt in notable discomfort. Lt ankle supported via pillows.

## 2022-07-19 NOTE — H&P (Signed)
PREOPERATIVE H&P  Chief Complaint: LEFT ANKLE FRACTURE  HPI: Katherine Harrison is a 57 y.o. female who presents with a diagnosis of LEFT ANKLE FRACTURE. Symptoms are rated as moderate to severe, and have been worsening.  This is significantly impairing activities of daily living.  She has elected for surgical management.   Past Medical History:  Diagnosis Date   Claudication (HCC)    Environmental allergies    Headache    migraine, visual aura-take aleve immediately to avoid migraines   Hypothyroidism    Muscle cramps    bilateral legs   OA (osteoarthritis)    PONV (postoperative nausea and vomiting)    Past Surgical History:  Procedure Laterality Date   BLADDER SUSPENSION N/A 03/04/2015   Procedure: TRANSVAGINAL TAPE (TVT) PROCEDURE;  Surgeon: Levi Aland, MD;  Location: [facility];  Service: Gynecology;  Laterality: N/A;   CYSTOSCOPY N/A 03/04/2015   Procedure: CYSTOSCOPY;  Surgeon: Levi Aland, MD;  Location: [facility];  Service: Gynecology;  Laterality: N/A;   DILITATION & CURRETTAGE/HYSTROSCOPY WITH HYDROTHERMAL ABLATION N/A 03/04/2015   Procedure: DILATATION & CURETTAGE/HYSTEROSCOPY WITH HYDROTHERMAL ABLATION;  Surgeon: Levi Aland, MD;  Location: [facility];  Service: Gynecology;  Laterality: N/A;   EYE SURGERY     eye muscle shortening surgery as toddler   TONSILLECTOMY     TUBAL LIGATION     WISDOM TOOTH EXTRACTION     Social History   Socioeconomic History   Marital status: Married    Spouse name: Not on file   Number of children: Not on file   Years of education: Not on file   Highest education level: Not on file  Occupational History   Not on file  Tobacco Use   Smoking status: Former    Packs/day: 0.50    Years: 15.00    Additional pack years: 0.00    Total pack years: 7.50    Types: Cigarettes    Quit date: 04/12/1998    Years since quitting: 24.2   Smokeless tobacco: Never  Substance and Sexual Activity   Alcohol use: Yes    Alcohol/week: 0.0 standard  drinks of alcohol    Comment: social   Drug use: No   Sexual activity: Yes    Birth control/protection: Surgical  Other Topics Concern   Not on file  Social History Narrative   Not on file   Social Determinants of Health   Financial Resource Strain: Not on file  Food Insecurity: Not on file  Transportation Needs: Not on file  Physical Activity: Not on file  Stress: Not on file  Social Connections: Not on file   No family history on file. Allergies  Allergen Reactions   Hydrocodone Nausea And Vomiting   Oxycodone Nausea And Vomiting   Prior to Admission medications   Medication Sig Start Date End Date Taking? Authorizing Provider  ARMOUR THYROID 60 MG tablet Take 120 mg by mouth daily before breakfast.  07/18/14   [provider]  cetirizine (ZYRTEC) 10 MG tablet Take 10 mg by mouth daily.    [provider]  HYDROmorphone (DILAUDID) 2 MG tablet Take 1 tablet (2 mg total) by mouth every 2 (two) hours as needed for severe pain. 03/05/15   Levi Aland, MD  ondansetron (ZOFRAN-ODT) 4 MG disintegrating tablet Take 1 tablet (4 mg total) by mouth every 8 (eight) hours as needed for nausea or vomiting. 07/19/22   Gloris Manchester, MD  oxyCODONE-acetaminophen (PERCOCET/ROXICET) 5-325 MG tablet Take 1  tablet by mouth every 6 (six) hours as needed for up to 5 days for severe pain. 07/19/22 07/24/22  Gloris Manchester, MD     Positive ROS: All other systems have been reviewed and were otherwise negative with the exception of those mentioned in the HPI and as above.  Physical Exam: General: Alert, no acute distress Cardiovascular: No pedal edema Respiratory: No cyanosis, no use of accessory musculature GI: No organomegaly, abdomen is soft and non-tender Skin: No lesions in the area of chief complaint Neurologic: Sensation intact distally Psychiatric: Patient is competent for consent with normal mood and affect Lymphatic: No axillary or cervical  lymphadenopathy  MUSCULOSKELETAL: LLE in splint, able to wiggle toes, NVI   Imaging: CT scan left ankle shows  - an oblique fracture through the base of the medial malleolus with mild impression of approximately 3.5 mm. - Comminuted fracture involving the posterior malleolus of the tibia with articular step-off measuring approximately 3 mm and 3 mm of depression at the articular surface - an avulsion fracture from the distal tip of the medial malleolus and associated medial mortise widening - Severely comminuted and complex fracture involving the entire distal shaft of the fibula including the lateral malleolus. Multiple butterfly type displaced fragments are noted   Assessment: LEFT ANKLE FRACTURE  Plan: Plan for Procedure(s): OPEN REDUCTION INTERNAL FIXATION (ORIF) ANKLE FRACTURE  The risks benefits and alternatives were discussed with the patient including but not limited to the risks of nonoperative treatment, versus surgical intervention including infection, bleeding, nerve injury,  blood clots, cardiopulmonary complications, morbidity, mortality, among others, and they were willing to proceed.   Weightbearing: NWB LLE Orthopedic devices: splint Showering: keep splint dry Dressing: reinforce PRN Medicines: ASA, Oxy, Tylenol, Mobic, Baclofen, Zofran  Discharge: home Follow up: 08/04/22 at 11:30am    Marzetta Board Office [phone] 07/19/2022 5:57 PM

## 2022-07-21 ENCOUNTER — Encounter (HOSPITAL_COMMUNITY): Payer: Self-pay

## 2022-07-21 NOTE — Patient Instructions (Addendum)
SURGICAL WAITING ROOM VISITATION  Patients having surgery or a procedure may have no more than 2 support people in the waiting area - these visitors may rotate.    Children under the age of 66 must have an adult with them who is not the patient.  Due to an increase in RSV and influenza rates and associated hospitalizations, children ages 21 and under may not visit patients in [facility] hospitals.  If the patient needs to stay at the hospital during part of their recovery, the visitor guidelines for inpatient rooms apply. Pre-op nurse will coordinate an appropriate time for 1 support person to accompany patient in pre-op.  This support person may not rotate.    Please refer to the [facility] website for the visitor guidelines for Inpatients (after your surgery is over and you are in a regular room).       Your procedure is scheduled on: Tuesday, July 25, 2022   Report to [facility] Main Entrance    Report to admitting at 8:15 AM   Call this number if you have problems the morning of surgery [phone]   Do not eat food :After Midnight.   After Midnight you may have the following liquids until 7:30 AM DAY OF SURGERY  Water Non-Citrus Juices (without pulp, NO RED-Apple, White grape, White cranberry) Black Coffee (NO MILK/CREAM OR CREAMERS, sugar ok)  Clear Tea (NO MILK/CREAM OR CREAMERS, sugar ok) regular and decaf                             Plain Jell-O (NO RED)                                           Fruit ices (not with fruit pulp, NO RED)                                     Popsicles (NO RED)                                                               Sports drinks like Gatorade (NO RED)                   The day of surgery:  Drink ONE (1) Pre-Surgery Clear Ensure at 7:30 AM the morning of surgery. Drink in one sitting. Do not sip.  This drink was given to you during your hospital  pre-op appointment visit. Nothing else to drink after completing the   Pre-Surgery Clear Ensure.          If you have questions, please contact your surgeon's office.   FOLLOW BOWEL PREP AND ANY ADDITIONAL PRE OP INSTRUCTIONS YOU RECEIVED FROM YOUR SURGEON'S OFFICE!!!     Oral Hygiene is also important to reduce your risk of infection.                                    Remember - BRUSH YOUR TEETH THE MORNING OF  SURGERY WITH YOUR REGULAR TOOTHPASTE  DENTURES WILL BE REMOVED PRIOR TO SURGERY PLEASE DO NOT APPLY "Poly grip" OR ADHESIVES!!!   Do NOT smoke after Midnight   Take these medicines the morning of surgery with A SIP OF WATER:  Levothyroxine                              You may not have any metal on your body including hair pins, jewelry, and body piercing             Do not wear make-up, lotions, powders, perfumes/cologne, or deodorant  Do not wear nail polish including gel and S&S, artificial/acrylic nails, or any other type of covering on natural nails including finger and toenails. If you have artificial nails, gel coating, etc. that needs to be removed by a nail salon please have this removed prior to surgery or surgery may need to be canceled/ delayed if the surgeon/ anesthesia feels like they are unable to be safely monitored.   Do not shave  48 hours prior to surgery.    Do not bring valuables to the hospital. [place] IS NOT             RESPONSIBLE   FOR VALUABLES.   Contacts, glasses, dentures or bridgework may not be worn into surgery.   Bring small overnight bag day of surgery.   DO NOT BRING YOUR HOME MEDICATIONS TO THE HOSPITAL. PHARMACY WILL DISPENSE MEDICATIONS LISTED ON YOUR MEDICATION LIST TO YOU DURING YOUR ADMISSION IN THE HOSPITAL!    Patients discharged on the day of surgery will not be allowed to drive home.  Someone NEEDS to stay with you for the first 24 hours after anesthesia.   Special Instructions: Bring a copy of your healthcare power of attorney and living will documents the day of surgery if you haven't  scanned them before.              Please read over the following fact sheets you were given: IF YOU HAVE QUESTIONS ABOUT YOUR PRE-OP INSTRUCTIONS PLEASE CALL [phone]   If you received a COVID test during your pre-op visit  it is requested that you wear a mask when out in public, stay away from anyone that may not be feeling well and notify your surgeon if you develop symptoms. If you test positive for Covid or have been in contact with anyone that has tested positive in the last 10 days please notify you surgeon.    [name] - Preparing for Surgery Before surgery, you can play an important role.  Because skin is not sterile, your skin needs to be as free of germs as possible.  You can reduce the number of germs on your skin by washing with CHG (chlorahexidine gluconate) soap before surgery.  CHG is an antiseptic cleaner which kills germs and bonds with the skin to continue killing germs even after washing. Please DO NOT use if you have an allergy to CHG or antibacterial soaps.  If your skin becomes reddened/irritated stop using the CHG and inform your nurse when you arrive at Short Stay. Do not shave (including legs and underarms) for at least 48 hours prior to the first CHG shower.  You may shave your face/neck.  Please follow these instructions carefully:  1.  Shower with CHG Soap the night before surgery and the  morning of surgery.  2.  If you choose to wash your hair,  wash your hair first as usual with your normal  shampoo.  3.  After you shampoo, rinse your hair and body thoroughly to remove the shampoo.                             4.  Use CHG as you would any other liquid soap.  You can apply chg directly to the skin and wash.  Gently with a scrungie or clean washcloth.  5.  Apply the CHG Soap to your body ONLY FROM THE NECK DOWN.   Do   not use on face/ open                           Wound or open sores. Avoid contact with eyes, ears mouth and   genitals (private parts).                        Wash face,  Genitals (private parts) with your normal soap.             6.  Wash thoroughly, paying special attention to the area where your    surgery  will be performed.  7.  Thoroughly rinse your body with warm water from the neck down.  8.  DO NOT shower/wash with your normal soap after using and rinsing off the CHG Soap.                9.  Pat yourself dry with a clean towel.            10.  Wear clean pajamas.            11.  Place clean sheets on your bed the night of your first shower and do not  sleep with pets. Day of Surgery : Do not apply any lotions/deodorants the morning of surgery.  Please wear clean clothes to the hospital/surgery center.  FAILURE TO FOLLOW THESE INSTRUCTIONS MAY RESULT IN THE CANCELLATION OF YOUR SURGERY  PATIENT SIGNATURE_________________________________  NURSE SIGNATURE__________________________________  ________________________________________________________________________ Katherine Harrison (Watch this video at home: [email or web address])  An incentive spirometer is a tool that can help keep your lungs clear and active. This tool measures how well you are filling your lungs with each breath. Taking long deep breaths may help reverse or decrease the chance of developing breathing (pulmonary) problems (especially infection) following: A long period of time when you are unable to move or be active. BEFORE THE PROCEDURE  If the spirometer includes an indicator to show your best effort, your nurse or respiratory therapist will set it to a desired goal. If possible, sit up straight or lean slightly forward. Try not to slouch. Hold the incentive spirometer in an upright position. INSTRUCTIONS FOR USE  Sit on the edge of your bed if possible, or sit up as far as you can in bed or on a chair. Hold the incentive spirometer in an upright position. Breathe out normally. Place the mouthpiece in your mouth and seal your lips  tightly around it. Breathe in slowly and as deeply as possible, raising the piston or the ball toward the top of the column. Hold your breath for 3-5 seconds or for as long as possible. Allow the piston or ball to fall to the bottom of the column. Remove the mouthpiece from your mouth and breathe out normally. Rest for a few seconds and repeat Steps 1 through  7 at least 10 times every 1-2 hours when you are awake. Take your time and take a few normal breaths between deep breaths. The spirometer may include an indicator to show your best effort. Use the indicator as a goal to work toward during each repetition. After each set of 10 deep breaths, practice coughing to be sure your lungs are clear. If you have an incision (the cut made at the time of surgery), support your incision when coughing by placing a pillow or rolled up towels firmly against it. Once you are able to get out of bed, walk around indoors and cough well. You may stop using the incentive spirometer when instructed by your caregiver.  RISKS AND COMPLICATIONS Take your time so you do not get dizzy or light-headed. If you are in pain, you may need to take or ask for pain medication before doing incentive spirometry. It is harder to take a deep breath if you are having pain. AFTER USE Rest and breathe slowly and easily. It can be helpful to keep track of a log of your progress. Your caregiver can provide you with a simple table to help with this. If you are using the spirometer at home, follow these instructions: SEEK MEDICAL CARE IF:  You are having difficultly using the spirometer. You have trouble using the spirometer as often as instructed. Your pain medication is not giving enough relief while using the spirometer. You develop fever of 100.5 F (38.1 C) or higher. SEEK IMMEDIATE MEDICAL CARE IF:  You cough up bloody sputum that had not been present before. You develop fever of 102 F (38.9 C) or greater. You develop  worsening pain at or near the incision site. MAKE SURE YOU:  Understand these instructions. Will watch your condition. Will get help right away if you are not doing well or get worse. Document Released: 05/22/2006 Document Revised: 04/03/2011 Document Reviewed: 07/23/2006 [facility] Patient Information 2014 [place], [place].

## 2022-07-24 ENCOUNTER — Encounter (HOSPITAL_COMMUNITY): Payer: Self-pay

## 2022-07-24 ENCOUNTER — Other Ambulatory Visit: Payer: Self-pay

## 2022-07-24 ENCOUNTER — Encounter (HOSPITAL_COMMUNITY)
Admission: RE | Admit: 2022-07-24 | Discharge: 2022-07-24 | Disposition: A | Discharge status: Home / Self Care | Payer: No Typology Code available for payment source | Source: Ambulatory Visit | Attending: Orthopedic Surgery | Admitting: Orthopedic Surgery

## 2022-07-24 HISTORY — DX: Deviated nasal septum: J34.2

## 2022-07-24 HISTORY — DX: Malignant (primary) neoplasm, unspecified: C80.1

## 2022-07-24 HISTORY — DX: Hypertrophy of nasal turbinates: J34.3

## 2022-07-24 NOTE — Progress Notes (Addendum)
COVID Vaccine received:  [x]  No []  Yes Date of any COVID positive Test in last 90 days: no PCP - Dr. Tenny Craw Cardiologist - no  Chest x-ray -07/19/22  CEW EKG -  no Stress Test - no ECHO - no Cardiac Cath - no  Bowel Prep - [x]  No  []   Yes ______  Pacemaker / ICD device [x]  No []  Yes   Spinal Cord Stimulator:[x]  No []  Yes       History of Sleep Apnea? [x]  No []  Yes   CPAP used?- [x]  No []  Yes    Does the patient monitor blood sugar?          [x]  No []  Yes  []  N/A  Patient has: [x]  NO Hx DM   []  Pre-DM                 []  DM1  []   DM2 Does patient have a [employer] or Dexacom? [x]  No []  Yes   Fasting Blood Sugar Ranges-  Checks Blood Sugar _____ times a day  GLP1 agonist / usual dose - no GLP1 instructions:  SGLT-2 inhibitors / usual dose - no SGLT-2 instructions:   Blood Thinner / Instructions:no Aspirin Instructions:no  Comments: spoke with Admitting while pt. On phone. They will call her back.  Activity level: Patient is unable to climb a flight of stairs without difficulty; [x]  No CP  [x]  No SOB, but would have _broke ankle__   Patient cannot  perform ADLs without assistance.   Anesthesia review:   Patient denies shortness of breath, fever, cough and chest pain at PAT appointment.  Patient verbalized understanding and agreement to the Pre-Surgical Instructions that were given to them at this PAT appointment. Patient was also educated of the need to review these PAT instructions again prior to his/her surgery.I reviewed the appropriate phone numbers to call if they have any and questions or concerns.

## 2022-07-25 ENCOUNTER — Encounter (HOSPITAL_COMMUNITY)
Admission: RE | Disposition: A | Discharge status: Home Health | Payer: Self-pay | Source: Home / Self Care | Attending: Orthopedic Surgery

## 2022-07-25 ENCOUNTER — Observation Stay (HOSPITAL_COMMUNITY)
Admission: RE | Admit: 2022-07-25 | Discharge: 2022-07-28 | Disposition: A | Discharge status: Home Health | Payer: No Typology Code available for payment source | Attending: Orthopedic Surgery | Admitting: Orthopedic Surgery

## 2022-07-25 ENCOUNTER — Ambulatory Visit (HOSPITAL_COMMUNITY): Payer: No Typology Code available for payment source | Admitting: Certified Registered"

## 2022-07-25 ENCOUNTER — Encounter (HOSPITAL_COMMUNITY): Payer: Self-pay | Admitting: Orthopedic Surgery

## 2022-07-25 ENCOUNTER — Other Ambulatory Visit: Payer: Self-pay

## 2022-07-25 DIAGNOSIS — X58XXXA Exposure to other specified factors, initial encounter: Secondary | ICD-10-CM | POA: Diagnosis not present

## 2022-07-25 DIAGNOSIS — Z79899 Other long term (current) drug therapy: Secondary | ICD-10-CM | POA: Insufficient documentation

## 2022-07-25 DIAGNOSIS — S93401A Sprain of unspecified ligament of right ankle, initial encounter: Secondary | ICD-10-CM | POA: Diagnosis present

## 2022-07-25 DIAGNOSIS — S82852A Displaced trimalleolar fracture of left lower leg, initial encounter for closed fracture: Secondary | ICD-10-CM | POA: Diagnosis present

## 2022-07-25 DIAGNOSIS — E039 Hypothyroidism, unspecified: Secondary | ICD-10-CM | POA: Insufficient documentation

## 2022-07-25 DIAGNOSIS — Z87891 Personal history of nicotine dependence: Secondary | ICD-10-CM | POA: Insufficient documentation

## 2022-07-25 DIAGNOSIS — S82892A Other fracture of left lower leg, initial encounter for closed fracture: Secondary | ICD-10-CM | POA: Diagnosis not present

## 2022-07-25 HISTORY — PX: ORIF ANKLE FRACTURE: SHX5408

## 2022-07-25 LAB — CBC
HCT: 37.9 % (ref 36.0–46.0)
Hemoglobin: 12.6 g/dL (ref 12.0–15.0)
MCH: 29.9 pg (ref 26.0–34.0)
MCHC: 33.2 g/dL (ref 30.0–36.0)
MCV: 89.8 fL (ref 80.0–100.0)
Platelets: 171 10*3/uL (ref 150–400)
RBC: 4.22 MIL/uL (ref 3.87–5.11)
RDW: 12.8 % (ref 11.5–15.5)
WBC: 9.8 10*3/uL (ref 4.0–10.5)
nRBC: 0 % (ref 0.0–0.2)

## 2022-07-25 LAB — CREATININE, SERUM
Creatinine, Ser: 0.6 mg/dL (ref 0.44–1.00)
GFR, Estimated: >60 mL/min (ref >60)

## 2022-07-25 SURGERY — OPEN REDUCTION INTERNAL FIXATION (ORIF) ANKLE FRACTURE
Anesthesia: General | Site: Ankle | Laterality: Left

## 2022-07-25 MED ORDER — ORAL CARE MOUTH RINSE: 15.0000 mL | Freq: Once | OROMUCOSAL | Status: AC

## 2022-07-25 MED ORDER — FENTANYL CITRATE (PF) 100 MCG/2ML IJ SOLN
INTRAMUSCULAR | Status: AC
  Filled 2022-07-25: qty 2

## 2022-07-25 MED ORDER — METOCLOPRAMIDE HCL 5 MG PO TABS: 5.0000 mg | ORAL_TABLET | Freq: Three times a day (TID) | ORAL | Status: DC | PRN

## 2022-07-25 MED ORDER — ADULT MULTIVITAMIN W/MINERALS CH
1.0000 | ORAL_TABLET | Freq: Every day | ORAL | Status: DC
  Administered 2022-07-25 – 2022-07-28 (×4): 1 via ORAL
  Filled 2022-07-25 (×4): qty 1

## 2022-07-25 MED ORDER — PANTOPRAZOLE SODIUM 40 MG PO TBEC
40.0000 mg | DELAYED_RELEASE_TABLET | Freq: Every day | ORAL | Status: DC
  Administered 2022-07-25 – 2022-07-28 (×4): 40 mg via ORAL
  Filled 2022-07-25 (×4): qty 1

## 2022-07-25 MED ORDER — FENTANYL CITRATE PF 50 MCG/ML IJ SOSY: 25.0000 ug | PREFILLED_SYRINGE | INTRAMUSCULAR | Status: DC | PRN

## 2022-07-25 MED ORDER — PROPOFOL 10 MG/ML IV BOLUS
INTRAVENOUS | Status: AC
  Filled 2022-07-25: qty 20

## 2022-07-25 MED ORDER — POVIDONE-IODINE 10 % EX SWAB
2.0000 | Freq: Once | CUTANEOUS | Status: AC
  Administered 2022-07-25: 2 via TOPICAL

## 2022-07-25 MED ORDER — AMISULPRIDE (ANTIEMETIC) 5 MG/2ML IV SOLN
INTRAVENOUS | Status: AC
  Filled 2022-07-25: qty 4

## 2022-07-25 MED ORDER — DEXAMETHASONE SODIUM PHOSPHATE 10 MG/ML IJ SOLN
INTRAMUSCULAR | Status: AC
  Filled 2022-07-25: qty 1

## 2022-07-25 MED ORDER — ONDANSETRON HCL 4 MG PO TABS
4.0000 mg | ORAL_TABLET | Freq: Four times a day (QID) | ORAL | Status: DC | PRN
  Administered 2022-07-25 – 2022-07-27 (×5): 4 mg via ORAL
  Filled 2022-07-25 (×5): qty 1

## 2022-07-25 MED ORDER — CEFAZOLIN SODIUM-DEXTROSE 2-4 GM/100ML-% IV SOLN
2.0000 g | INTRAVENOUS | Status: AC
  Administered 2022-07-25: 2 g via INTRAVENOUS
  Filled 2022-07-25: qty 100

## 2022-07-25 MED ORDER — ACETAMINOPHEN 500 MG PO TABS
1000.0000 mg | ORAL_TABLET | Freq: Once | ORAL | Status: AC
  Administered 2022-07-25: 1000 mg via ORAL
  Filled 2022-07-25: qty 2

## 2022-07-25 MED ORDER — FENTANYL CITRATE PF 50 MCG/ML IJ SOSY
50.0000 ug | PREFILLED_SYRINGE | Freq: Once | INTRAMUSCULAR | Status: AC
  Administered 2022-07-25: 50 ug via INTRAVENOUS
  Filled 2022-07-25: qty 2

## 2022-07-25 MED ORDER — METOCLOPRAMIDE HCL 5 MG/ML IJ SOLN: 5.0000 mg | Freq: Three times a day (TID) | INTRAMUSCULAR | Status: DC | PRN

## 2022-07-25 MED ORDER — ACETAMINOPHEN 500 MG PO TABS
1000.0000 mg | ORAL_TABLET | Freq: Four times a day (QID) | ORAL | Status: AC
  Administered 2022-07-25 – 2022-07-26 (×3): 1000 mg via ORAL
  Filled 2022-07-25 (×3): qty 2

## 2022-07-25 MED ORDER — CEFAZOLIN SODIUM-DEXTROSE 2-4 GM/100ML-% IV SOLN
2.0000 g | Freq: Four times a day (QID) | INTRAVENOUS | Status: AC
  Administered 2022-07-25 – 2022-07-26 (×3): 2 g via INTRAVENOUS
  Filled 2022-07-25 (×3): qty 100

## 2022-07-25 MED ORDER — SCOPOLAMINE 1 MG/3DAYS TD PT72
MEDICATED_PATCH | TRANSDERMAL | Status: AC
  Filled 2022-07-25: qty 1

## 2022-07-25 MED ORDER — MIDAZOLAM HCL 2 MG/2ML IJ SOLN
INTRAMUSCULAR | Status: AC
  Filled 2022-07-25: qty 2

## 2022-07-25 MED ORDER — BISACODYL 10 MG RE SUPP: 10.0000 mg | Freq: Every day | RECTAL | Status: DC | PRN

## 2022-07-25 MED ORDER — MIDAZOLAM HCL 2 MG/2ML IJ SOLN
1.0000 mg | Freq: Once | INTRAMUSCULAR | Status: AC
  Administered 2022-07-25: 1 mg via INTRAVENOUS
  Filled 2022-07-25: qty 2

## 2022-07-25 MED ORDER — CHLORHEXIDINE GLUCONATE 0.12 % MT SOLN
15.0000 mL | Freq: Once | OROMUCOSAL | Status: AC
  Administered 2022-07-25: 15 mL via OROMUCOSAL

## 2022-07-25 MED ORDER — DEXAMETHASONE SODIUM PHOSPHATE 10 MG/ML IJ SOLN
8.0000 mg | Freq: Once | INTRAMUSCULAR | Status: AC
  Administered 2022-07-25: 8 mg via INTRAVENOUS

## 2022-07-25 MED ORDER — FENTANYL CITRATE (PF) 250 MCG/5ML IJ SOLN
INTRAMUSCULAR | Status: DC | PRN
  Administered 2022-07-25 (×4): 25 ug via INTRAVENOUS

## 2022-07-25 MED ORDER — OXYCODONE HCL 5 MG PO TABS
10.0000 mg | ORAL_TABLET | ORAL | Status: DC | PRN
  Administered 2022-07-26 (×2): 15 mg via ORAL
  Administered 2022-07-27 (×2): 10 mg via ORAL
  Administered 2022-07-27: 15 mg via ORAL
  Administered 2022-07-27: 10 mg via ORAL
  Filled 2022-07-25 (×3): qty 3
  Filled 2022-07-25 (×2): qty 2
  Filled 2022-07-25: qty 3

## 2022-07-25 MED ORDER — AMISULPRIDE (ANTIEMETIC) 5 MG/2ML IV SOLN
10.0000 mg | Freq: Once | INTRAVENOUS | Status: AC | PRN
  Administered 2022-07-25: 10 mg via INTRAVENOUS

## 2022-07-25 MED ORDER — ONDANSETRON HCL 4 MG/2ML IJ SOLN: 4.0000 mg | Freq: Four times a day (QID) | INTRAMUSCULAR | Status: DC | PRN

## 2022-07-25 MED ORDER — LACTATED RINGERS IV SOLN: INTRAVENOUS | Status: DC

## 2022-07-25 MED ORDER — ACETAMINOPHEN 325 MG PO TABS
325.0000 mg | ORAL_TABLET | Freq: Four times a day (QID) | ORAL | Status: DC | PRN
  Administered 2022-07-27 – 2022-07-28 (×2): 650 mg via ORAL
  Filled 2022-07-25 (×2): qty 2

## 2022-07-25 MED ORDER — ONDANSETRON HCL 4 MG/2ML IJ SOLN
INTRAMUSCULAR | Status: AC
  Filled 2022-07-25: qty 2

## 2022-07-25 MED ORDER — PROPOFOL 10 MG/ML IV BOLUS
INTRAVENOUS | Status: DC | PRN
  Administered 2022-07-25: 200 mg via INTRAVENOUS
  Administered 2022-07-25: 25 ug/kg/min via INTRAVENOUS

## 2022-07-25 MED ORDER — DIPHENHYDRAMINE HCL 12.5 MG/5ML PO ELIX
12.5000 mg | ORAL_SOLUTION | ORAL | Status: DC | PRN
  Administered 2022-07-27: 25 mg via ORAL
  Filled 2022-07-25: qty 10

## 2022-07-25 MED ORDER — 0.9 % SODIUM CHLORIDE (POUR BTL) OPTIME
TOPICAL | Status: DC | PRN
  Administered 2022-07-25: 1000 mL

## 2022-07-25 MED ORDER — BUPIVACAINE HCL (PF) 0.5 % IJ SOLN
INTRAMUSCULAR | Status: AC
  Filled 2022-07-25: qty 30

## 2022-07-25 MED ORDER — POLYETHYLENE GLYCOL 3350 17 G PO PACK
17.0000 g | PACK | Freq: Every day | ORAL | Status: DC | PRN
  Administered 2022-07-26 – 2022-07-27 (×2): 17 g via ORAL
  Filled 2022-07-25 (×2): qty 1

## 2022-07-25 MED ORDER — HYDROMORPHONE HCL 1 MG/ML IJ SOLN: 0.5000 mg | INTRAMUSCULAR | Status: DC | PRN

## 2022-07-25 MED ORDER — BUPIVACAINE-EPINEPHRINE (PF) 0.5% -1:200000 IJ SOLN
INTRAMUSCULAR | Status: DC | PRN
  Administered 2022-07-25: 30 mL via PERINEURAL

## 2022-07-25 MED ORDER — KETOROLAC TROMETHAMINE 30 MG/ML IJ SOLN
INTRAMUSCULAR | Status: DC | PRN
  Administered 2022-07-25: 30 mg via INTRAVENOUS

## 2022-07-25 MED ORDER — DOCUSATE SODIUM 100 MG PO CAPS
100.0000 mg | ORAL_CAPSULE | Freq: Two times a day (BID) | ORAL | Status: DC
  Administered 2022-07-25 – 2022-07-28 (×7): 100 mg via ORAL
  Filled 2022-07-25 (×7): qty 1

## 2022-07-25 MED ORDER — FERROUS SULFATE 325 (65 FE) MG PO TABS
325.0000 mg | ORAL_TABLET | Freq: Every day | ORAL | Status: DC
  Administered 2022-07-25 – 2022-07-28 (×4): 325 mg via ORAL
  Filled 2022-07-25 (×4): qty 1

## 2022-07-25 MED ORDER — LIDOCAINE 2% (20 MG/ML) 5 ML SYRINGE
INTRAMUSCULAR | Status: DC | PRN
  Administered 2022-07-25: 20 mg via INTRAVENOUS

## 2022-07-25 MED ORDER — TRANEXAMIC ACID-NACL 1000-0.7 MG/100ML-% IV SOLN
1000.0000 mg | INTRAVENOUS | Status: AC
  Administered 2022-07-25: 1000 mg via INTRAVENOUS
  Filled 2022-07-25: qty 100

## 2022-07-25 MED ORDER — METHOCARBAMOL 1000 MG/10ML IJ SOLN: 500.0000 mg | Freq: Four times a day (QID) | INTRAVENOUS | Status: DC | PRN

## 2022-07-25 MED ORDER — LEVOTHYROXINE SODIUM 25 MCG PO TABS
137.0000 ug | ORAL_TABLET | Freq: Every day | ORAL | Status: DC
  Administered 2022-07-26 – 2022-07-28 (×3): 137 ug via ORAL
  Filled 2022-07-25 (×3): qty 1

## 2022-07-25 MED ORDER — METHOCARBAMOL 500 MG PO TABS
500.0000 mg | ORAL_TABLET | Freq: Four times a day (QID) | ORAL | Status: DC | PRN
  Administered 2022-07-25 – 2022-07-27 (×6): 500 mg via ORAL
  Filled 2022-07-25 (×6): qty 1

## 2022-07-25 MED ORDER — ONDANSETRON HCL 4 MG/2ML IJ SOLN
INTRAMUSCULAR | Status: DC | PRN
  Administered 2022-07-25: 4 mg via INTRAVENOUS

## 2022-07-25 MED ORDER — OXYCODONE HCL 5 MG PO TABS
5.0000 mg | ORAL_TABLET | ORAL | Status: DC | PRN
  Administered 2022-07-25: 5 mg via ORAL
  Administered 2022-07-26 – 2022-07-28 (×4): 10 mg via ORAL
  Filled 2022-07-25 (×4): qty 2
  Filled 2022-07-25: qty 1

## 2022-07-25 MED ORDER — CLONIDINE HCL (ANALGESIA) 100 MCG/ML EP SOLN
EPIDURAL | Status: DC | PRN
  Administered 2022-07-25 (×2): 50 ug

## 2022-07-25 MED ORDER — SCOPOLAMINE 1 MG/3DAYS TD PT72
MEDICATED_PATCH | TRANSDERMAL | Status: DC | PRN
  Administered 2022-07-25: 1 via TRANSDERMAL

## 2022-07-25 MED ORDER — ROPIVACAINE HCL 5 MG/ML IJ SOLN
INTRAMUSCULAR | Status: DC | PRN
  Administered 2022-07-25: 20 mL via PERINEURAL

## 2022-07-25 MED ORDER — ENOXAPARIN SODIUM 40 MG/0.4ML IJ SOSY
40.0000 mg | PREFILLED_SYRINGE | INTRAMUSCULAR | Status: DC
  Administered 2022-07-26 – 2022-07-28 (×3): 40 mg via SUBCUTANEOUS
  Filled 2022-07-25 (×3): qty 0.4

## 2022-07-25 SURGICAL SUPPLY — 82 items
APL PRP STRL LF DISP 70% ISPRP (MISCELLANEOUS) ×1
BAG COUNTER SPONGE SURGICOUNT (BAG) ×1 IMPLANT
BAG SPEC THK2 15X12 ZIP CLS (MISCELLANEOUS) ×1
BAG SPNG CNTER NS LX DISP (BAG) ×1
BAG ZIPLOCK 12X15 (MISCELLANEOUS) ×1 IMPLANT
BANDAGE ESMARK 6X9 LF (GAUZE/BANDAGES/DRESSINGS) ×1 IMPLANT
BIT DRILL 2.6 (BIT) ×1
BIT DRILL 2.6X VARIAX 2 (BIT) IMPLANT
BIT DRILL CANN 2.7 (BIT) ×1
BIT DRILL SRG 2.7XCANN AO CPLG (BIT) IMPLANT
BIT DRL 2.6X VARIAX 2 (BIT) ×1
BIT DRL SRG 2.7XCANN AO CPLNG (BIT) ×1
BLADE SURG 15 STRL LF DISP TIS (BLADE) ×1 IMPLANT
BLADE SURG 15 STRL SS (BLADE) ×1
BNDG CMPR 5X4 KNIT ELC UNQ LF (GAUZE/BANDAGES/DRESSINGS) ×1
BNDG CMPR 5X6 CHSV STRCH STRL (GAUZE/BANDAGES/DRESSINGS) ×1
BNDG CMPR 5X62 HK CLSR LF (GAUZE/BANDAGES/DRESSINGS) ×1
BNDG CMPR 6"X 5 YARDS HK CLSR (GAUZE/BANDAGES/DRESSINGS) ×1
BNDG CMPR 9X6 STRL LF SNTH (GAUZE/BANDAGES/DRESSINGS) ×1
BNDG COHESIVE 6X5 TAN ST LF (GAUZE/BANDAGES/DRESSINGS) ×1 IMPLANT
BNDG ELASTIC 4INX 5YD STR LF (GAUZE/BANDAGES/DRESSINGS) ×1 IMPLANT
BNDG ELASTIC 6INX 5YD STR LF (GAUZE/BANDAGES/DRESSINGS) IMPLANT
BNDG ESMARK 6X9 LF (GAUZE/BANDAGES/DRESSINGS) ×1
CHLORAPREP W/TINT 26 (MISCELLANEOUS) ×1 IMPLANT
CLSR STERI-STRIP ANTIMIC 1/2X4 (GAUZE/BANDAGES/DRESSINGS) IMPLANT
COVER MAYO STAND STRL (DRAPES) ×1 IMPLANT
COVER SURGICAL LIGHT HANDLE (MISCELLANEOUS) ×1 IMPLANT
CUFF TOURN SGL QUICK 34 (TOURNIQUET CUFF) ×1
CUFF TRNQT CYL 34X4.125X (TOURNIQUET CUFF) ×1 IMPLANT
DRAPE C-ARM 42X120 X-RAY (DRAPES) IMPLANT
DRAPE IMP U-DRAPE 54X76 (DRAPES) ×1 IMPLANT
DRAPE U-SHAPE 47X51 STRL (DRAPES) ×1 IMPLANT
DRSG ADAPTIC 3X8 NADH LF (GAUZE/BANDAGES/DRESSINGS) IMPLANT
DRSG EMULSION OIL 3X3 NADH (GAUZE/BANDAGES/DRESSINGS) ×1 IMPLANT
ELECT REM PT RETURN 15FT ADLT (MISCELLANEOUS) ×1 IMPLANT
GAUZE PAD ABD 7.5X8 STRL (GAUZE/BANDAGES/DRESSINGS) IMPLANT
GAUZE PAD ABD 8X10 STRL (GAUZE/BANDAGES/DRESSINGS) ×2 IMPLANT
GAUZE SPONGE 4X4 12PLY STRL (GAUZE/BANDAGES/DRESSINGS) ×1 IMPLANT
GLOVE BIO SURGEON STRL SZ7.5 (GLOVE) ×2 IMPLANT
GLOVE BIOGEL PI IND STRL 7.5 (GLOVE) ×1 IMPLANT
GLOVE BIOGEL PI IND STRL 8 (GLOVE) ×1 IMPLANT
GOWN STRL REUS W/ TWL LRG LVL3 (GOWN DISPOSABLE) ×1 IMPLANT
GOWN STRL REUS W/TWL LRG LVL3 (GOWN DISPOSABLE) ×1
K-WIRE 1.6X150 (WIRE) ×1
K-WIRE FX150X1.6XKRSH (WIRE) ×1
K-WIRE ORTHOPEDIC 1.4X150L (WIRE) ×2
KIT BASIN OR (CUSTOM PROCEDURE TRAY) ×1 IMPLANT
KIT TURNOVER KIT A (KITS) IMPLANT
KWIRE FX150X1.6XKRSH (WIRE) IMPLANT
KWIRE ORTHOPEDIC 1.4X150L (WIRE) IMPLANT
MANIFOLD NEPTUNE II (INSTRUMENTS) ×1 IMPLANT
NDL HYPO 22X1.5 SAFETY MO (MISCELLANEOUS) ×1 IMPLANT
NEEDLE HYPO 22X1.5 SAFETY MO (MISCELLANEOUS) ×1 IMPLANT
NS IRRIG 1000ML POUR BTL (IV SOLUTION) ×1 IMPLANT
PACK ORTHO EXTREMITY (CUSTOM PROCEDURE TRAY) ×1 IMPLANT
PAD CAST 4YDX4 CTTN HI CHSV (CAST SUPPLIES) ×2 IMPLANT
PADDING CAST COTTON 4X4 STRL (CAST SUPPLIES) ×1
PADDING CAST COTTON 6X4 STRL (CAST SUPPLIES) IMPLANT
PENCIL SMOKE EVACUATOR (MISCELLANEOUS) IMPLANT
PLATE FIBULA 5H (Plate) IMPLANT
PROTECTOR NERVE ULNAR (MISCELLANEOUS) ×1 IMPLANT
SCREW BONE 3.5X14 LOCKING (Screw) IMPLANT
SCREW BONE 3.5X16MM (Screw) IMPLANT
SCREW BONE THRD T10 3.5X12 (Screw) IMPLANT
SCREW CANNULATED 4.0 (Screw) IMPLANT
SCREW CANNULATED 4.0X36MM PT (Screw) IMPLANT
SCREW LOCK HEX T10 3.5X12 (Screw) IMPLANT
SPIKE FLUID TRANSFER (MISCELLANEOUS) IMPLANT
SUCTION TUBE FRAZIER 10FR DISP (SUCTIONS) ×1 IMPLANT
SUT ETHILON 3 0 PS 1 (SUTURE) ×2 IMPLANT
SUT MNCRL AB 4-0 PS2 18 (SUTURE) IMPLANT
SUT MON AB 2-0 CT1 36 (SUTURE) ×2 IMPLANT
SUT MON AB 3-0 SH 27 (SUTURE)
SUT MON AB 3-0 SH27 (SUTURE) IMPLANT
SUT VIC AB 0 CT1 36 (SUTURE) ×1 IMPLANT
SUT VIC AB 2-0 SH 27 (SUTURE)
SUT VIC AB 2-0 SH 27XBRD (SUTURE) IMPLANT
SYNDESMOSIS TIGHTROPE XP (Orthopedic Implant) IMPLANT
SYR CONTROL 10ML LL (SYRINGE) ×1 IMPLANT
TOWEL OR 17X26 10 PK STRL BLUE (TOWEL DISPOSABLE) ×1 IMPLANT
TOWEL OR NON WOVEN STRL DISP B (DISPOSABLE) ×1 IMPLANT
UNDERPAD 30X36 HEAVY ABSORB (UNDERPADS AND DIAPERS) ×1 IMPLANT

## 2022-07-25 NOTE — Anesthesia Procedure Notes (Signed)
Procedure Name: LMA Insertion Date/Time: 07/25/2022 10:13 AM  Performed by: Ponciano Ort, CRNAPre-anesthesia Checklist: Patient identified, Emergency Drugs available, Suction available and Patient being monitored Patient Re-evaluated:Patient Re-evaluated prior to induction Oxygen Delivery Method: Circle system utilized Preoxygenation: Pre-oxygenation with 100% oxygen Induction Type: IV induction Ventilation: Mask ventilation without difficulty Laryngoscope Size: Mac and 3 Tube type: Oral Number of attempts: 1 Airway Equipment and Method: Stylet and Oral airway Placement Confirmation: positive ETCO2 and breath sounds checked- equal and bilateral Tube secured with: Tape Dental Injury: Teeth and Oropharynx as per pre-operative assessment

## 2022-07-25 NOTE — Anesthesia Procedure Notes (Signed)
Anesthesia Regional Block: Adductor canal block   Pre-Anesthetic Checklist: , timeout performed,  Correct Patient, Correct Site, Correct Laterality,  Correct Procedure, Correct Position, site marked,  Risks and benefits discussed,  Surgical consent,  Pre-op evaluation,  At surgeon's request and post-op pain management  Laterality: Left  Prep: chloraprep       Needles:  Injection technique: Single-shot  Needle Type: Echogenic Needle     Needle Length: 9cm  Needle Gauge: 21     Additional Needles:   Procedures:,,,, ultrasound used (permanent image in chart),,    Narrative:  Start time: 07/25/2022 9:46 AM End time: 07/25/2022 9:50 AM Injection made incrementally with aspirations every 5 mL.  Performed by: Personally  Anesthesiologist: Marcene Duos, MD

## 2022-07-25 NOTE — Anesthesia Procedure Notes (Signed)
Anesthesia Regional Block: Popliteal block   Pre-Anesthetic Checklist: , timeout performed,  Correct Patient, Correct Site, Correct Laterality,  Correct Procedure, Correct Position, site marked,  Risks and benefits discussed,  Surgical consent,  Pre-op evaluation,  At surgeon's request and post-op pain management  Laterality: Left  Prep: chloraprep       Needles:  Injection technique: Single-shot  Needle Type: Echogenic Needle     Needle Length: 9cm  Needle Gauge: 21     Additional Needles:   Procedures:,,,, ultrasound used (permanent image in chart),,    Narrative:  Start time: 07/25/2022 9:40 AM End time: 07/25/2022 9:46 AM Injection made incrementally with aspirations every 5 mL.  Performed by: Personally  Anesthesiologist: Marcene Duos, MD

## 2022-07-25 NOTE — Discharge Instructions (Signed)

## 2022-07-25 NOTE — Transfer of Care (Signed)
Immediate Anesthesia Transfer of Care Note  Patient: Katherine Harrison  Procedure(s) Performed: LEFT OPEN REDUCTION INTERNAL FIXATION (ORIF) ANKLE FRACTURE (Left: Ankle)  Patient Location: PACU  Anesthesia Type:Regional and GA combined with regional for post-op pain  Level of Consciousness: awake, alert , oriented, and patient cooperative  Airway & Oxygen Therapy: Patient Spontanous Breathing and Patient connected to face mask oxygen  Post-op Assessment: Report given to RN and Post -op Vital signs reviewed and stable  Post vital signs: Reviewed and stable  Last Vitals:  Vitals Value Taken Time  BP 144/72 07/25/22 1232  Temp    Pulse 90 07/25/22 1233  Resp 17 07/25/22 1233  SpO2 100 % 07/25/22 1233  Vitals shown include unvalidated device data.  Last Pain:  Vitals:   07/25/22 1000  TempSrc:   PainSc: 0-No pain         Complications: No notable events documented.

## 2022-07-25 NOTE — Anesthesia Preprocedure Evaluation (Addendum)
Anesthesia Evaluation  Patient identified by MRN, date of birth, ID band Patient awake    Reviewed: Allergy & Precautions, NPO status , Patient's Chart, lab work & pertinent test results  History of Anesthesia Complications (+) PONV and history of anesthetic complications  Airway Mallampati: II  TM Distance: >3 FB Neck ROM: Full    Dental  (+) Dental Advisory Given   Pulmonary former smoker   breath sounds clear to auscultation       Cardiovascular negative cardio ROS  Rhythm:Regular Rate:Normal     Neuro/Psych  Headaches    GI/Hepatic negative GI ROS, Neg liver ROS,,,  Endo/Other  Hypothyroidism    Renal/GU negative Renal ROS     Musculoskeletal  (+) Arthritis ,    Abdominal   Peds  Hematology negative hematology ROS (+)   Anesthesia Other Findings   Reproductive/Obstetrics                             Anesthesia Physical Anesthesia Plan  ASA: 2  Anesthesia Plan: General   Post-op Pain Management: Regional block*, Tylenol PO (pre-op)* and Toradol IV (intra-op)*   Induction: Intravenous  PONV Risk Score and Plan: 3 and 4 or greater and Dexamethasone, Ondansetron, Midazolam, Treatment may vary due to age or medical condition, Scopolamine patch - Pre-op and Propofol infusion  Airway Management Planned: LMA  Additional Equipment:   Intra-op Plan:   Post-operative Plan: Extubation in OR  Informed Consent: I have reviewed the patients History and Physical, chart, labs and discussed the procedure including the risks, benefits and alternatives for the proposed anesthesia with the patient or authorized representative who has indicated his/her understanding and acceptance.     Dental advisory given  Plan Discussed with: CRNA  Anesthesia Plan Comments:        Anesthesia Quick Evaluation

## 2022-07-25 NOTE — Op Note (Signed)
07/25/2022  12:15 PM  PATIENT:  Katherine Harrison    PRE-OPERATIVE DIAGNOSIS:  LEFT ANKLE FRACTURE  POST-OPERATIVE DIAGNOSIS:  Same  PROCEDURE:  LEFT OPEN REDUCTION INTERNAL FIXATION (ORIF) ANKLE FRACTURE  SURGEON:  Sheral Apley, MD  ASSISTANT: Levester Fresh, PA-C, he was present and scrubbed throughout the case, critical for completion in a timely fashion, and for retraction, instrumentation, and closure.   ANESTHESIA:   gen   PREOPERATIVE INDICATIONS:  FLORABELLE CRUMP is a  57 y.o. female with a diagnosis of LEFT ANKLE FRACTURE who failed conservative measures and elected for surgical management.    The risks benefits and alternatives were discussed with the patient preoperatively including but not limited to the risks of infection, bleeding, nerve injury, cardiopulmonary complications, the need for revision surgery, among others, and the patient was willing to proceed.  OPERATIVE IMPLANTS: Stryker titanium plate and cannulated screw Arthrex tight ropes  OPERATIVE FINDINGS: Unstable ankle fracture. Stable syndesmosis post op  BLOOD LOSS: min  COMPLICATIONS: none  TOURNIQUET TIME: Roughly 90 minutes  OPERATIVE PROCEDURE:  Patient was identified in the preoperative holding area and site was marked by me He was transported to the operating theater and placed on the table in supine position taking care to pad all bony prominences. After a preincinduction time out anesthesia was induced. The left lower lower extremity was prepped and draped in normal sterile fashion and a pre-incision timeout was performed. Katherine Harrison received Ancef for preoperative antibiotics.   I made a an examination of her leg did show some blistering but swelling was relatively well-controlled I was able to avoid these blisters for her incisions  I made a lateral incision and dissected to her fibular fracture it was highly comminuted I elected to place a titanium locking plate here I fixed this distally  and then was able to pull it out to length I then placed proximal fixation  Syndesmosis was unstable so I placed 2 tight ropes across this  I examined the medial mall this fracture had reduced well and was very small would not hold fixation so I elected to use the tight ropes to maintain the stability  I then placed a front to back screw under direct visualization with fluoroscopy fixing the posterior mall after reduction  I took 4+ x-rays of the ankle and was happy with the alignment of all reduction and hardware placement thorough irrigation was performed followed by closure in layers   The wound was then thoroughly irrigated and closed using a 0 Vicryl and absorbable Monocryl sutures. He was placed in a short leg splint.   POST OPERATIVE PLAN: Non-weightbearing. DVT prophylaxis will consist of mobilization and chemical px

## 2022-07-26 ENCOUNTER — Encounter (HOSPITAL_COMMUNITY): Payer: Self-pay | Admitting: Orthopedic Surgery

## 2022-07-26 DIAGNOSIS — S82852A Displaced trimalleolar fracture of left lower leg, initial encounter for closed fracture: Secondary | ICD-10-CM | POA: Diagnosis not present

## 2022-07-26 DIAGNOSIS — S93401A Sprain of unspecified ligament of right ankle, initial encounter: Secondary | ICD-10-CM | POA: Diagnosis present

## 2022-07-26 MED ORDER — PREDNISONE 10 MG (21) PO TBPK
10.0000 mg | ORAL_TABLET | Freq: Four times a day (QID) | ORAL | Status: DC
  Administered 2022-07-28 (×2): 10 mg via ORAL

## 2022-07-26 MED ORDER — PREDNISONE 10 MG (21) PO TBPK
20.0000 mg | ORAL_TABLET | Freq: Every evening | ORAL | Status: AC
  Administered 2022-07-27: 20 mg via ORAL

## 2022-07-26 MED ORDER — PREDNISONE 10 MG (21) PO TBPK
10.0000 mg | ORAL_TABLET | Freq: Three times a day (TID) | ORAL | Status: AC
  Administered 2022-07-27 (×3): 10 mg via ORAL

## 2022-07-26 MED ORDER — PREDNISONE 10 MG (21) PO TBPK
10.0000 mg | ORAL_TABLET | ORAL | Status: AC
  Administered 2022-07-26: 10 mg via ORAL

## 2022-07-26 MED ORDER — PREDNISONE 10 MG (21) PO TBPK
20.0000 mg | ORAL_TABLET | Freq: Every evening | ORAL | Status: AC
  Administered 2022-07-26: 20 mg via ORAL

## 2022-07-26 MED ORDER — PREDNISONE 10 MG (21) PO TBPK
20.0000 mg | ORAL_TABLET | Freq: Every morning | ORAL | Status: AC
  Administered 2022-07-26: 20 mg via ORAL
  Filled 2022-07-26: qty 21

## 2022-07-26 NOTE — Progress Notes (Signed)
Subjective: Patient reports pain as marked.  Tolerating diet.  Urinating.   No CP, SOB.  Hasn't mobilized OOB with PT yet. Likely will be difficult due to concurrent R ankle sprain.  Objective:   VITALS:   Vitals:   07/25/22 1609 07/25/22 2120 07/26/22 0137 07/26/22 0542  BP: (!) 160/60 (!) 137/52 (!) 142/66 (!) 134/57  Pulse: 85 63 63 (!) 59  Resp: 17 20 16 18   Temp: 98.2 F (36.8 C) 98.4 F (36.9 C) (!) 97.5 F (36.4 C) 98.5 F (36.9 C)  TempSrc: Oral Oral Oral Oral  SpO2: 94% 96% 94% 98%  Weight:      Height:          Latest Ref Rng & Units 07/25/2022    2:38 PM 07/19/2022    8:33 AM 03/01/2015    9:15 AM  CBC  WBC 4.0 - 10.5 K/uL 9.8  10.2  7.4   Hemoglobin 12.0 - 15.0 g/dL 16.1  09.6  04.5   Hematocrit 36.0 - 46.0 % 37.9  42.0  36.7   Platelets 150 - 400 K/uL 171  159  164       Latest Ref Rng & Units 07/25/2022    2:38 PM 07/19/2022    8:33 AM  BMP  Glucose 70 - 99 mg/dL  409   BUN 6 - 20 mg/dL  11   Creatinine 8.11 - 1.00 mg/dL 9.14  7.82   Sodium 956 - 145 mmol/L  136   Potassium 3.5 - 5.1 mmol/L  4.0   Chloride 98 - 111 mmol/L  106   CO2 22 - 32 mmol/L  20   Calcium 8.9 - 10.3 mg/dL  9.0    Intake/Output      07/02 0701 07/03 0700 07/03 0701 07/04 0700   P.O. 780    I.V. (mL/kg) 900 (9.6)    IV Piggyback 400    Total Intake(mL/kg) 2080 (22.1)    Urine (mL/kg/hr) 0    Stool 0    Blood 5    Total Output 5    Net +2075         Urine Occurrence 0 x    Stool Occurrence 0 x       Physical Exam: General: NAD.  Sitting up in bed, calm, comfortable Resp: No increased wob Cardio: regular rate and rhythm ABD soft Neurologically intact MSK Neurovascularly intact Sensation intact distally Intact pulses distally Dorsiflexion/Plantar flexion intact R ankle but decreased Able to wiggle toes both feet Incision: dressing C/D/I Splint to LLE Extensive edema and ecchymosis to R ankle both medial and lateral as well as dorsum and sole of foot    Assessment: 1 Day Post-Op  S/P Procedure(s) (LRB): LEFT OPEN REDUCTION INTERNAL FIXATION (ORIF) ANKLE FRACTURE (Left) by Dr. Jewel Baize. Eulah Pont on 07/25/22  Principal Problem:   Closed displaced trimalleolar fracture of left ankle   Plan:  Advance diet Up with therapy Incentive Spirometry Elevate both feet as much as possible and Apply ice to R foot/ankle   Weightbearing: NWB LLE, WBAT RLE in boot Insicional and dressing care: Dressings left intact until follow-up and Reinforce dressings as needed Orthopedic device(s): Splint and CAM boot Showering: Keep dressing dry VTE prophylaxis: Aspirin 81mg  BID  x 30 days , SCDs, ambulation Pain control: PRN Follow - up plan: 2 weeks Contact information:  Margarita Rana MD, Levester Fresh PA-C  Dispo: Home once pain controlled and mobilizes with PT. Difficult due to concurrent R ankle sprain.  Jenne Pane, PA-C Office [phone] 07/26/2022, 8:44 AM

## 2022-07-26 NOTE — Plan of Care (Signed)

## 2022-07-26 NOTE — Evaluation (Addendum)
Physical Therapy Evaluation Patient Details Name: Katherine Harrison MRN: [id number] DOB: Jun 27, 1965 Today's Date: 07/26/2022  History of Present Illness  Pt brought to [facility] after fall while going up stairs at home on 07/19/22,  had immediate ankle pain and could not get up.  x-rays showed L  ankle fx She was reduced and plan was to d/c home for OP f/u but could not tolerate pain meds nor pain. pt elected for surgical management and underwent ORIF L ankle 07/25/22. PMH: bladder suspension, OA, claudication  Clinical Impression  Pt admitted with above diagnosis.  PT min assist to min/guard assist for bed mobility and transfers, able to adhere to NWB LLE with cues. Will benefit from PT in acute setting as well as post acute rehab to prevent falls, incr strength and activity tolerance  Pt currently with functional limitations due to the deficits listed below (see PT Problem List). Pt will benefit from acute skilled PT to increase their independence and safety with mobility to allow discharge.           Assistance Recommended at Discharge Intermittent Supervision/Assistance  If plan is discharge home, recommend the following:  Can travel by private vehicle  Assist for transportation;Help with stairs or ramp for entrance;Assistance with cooking/housework;A little help with bathing/dressing/bathroom        Equipment Recommendations None recommended by PT  Recommendations for Other Services       Functional Status Assessment Patient has had a recent decline in their functional status and demonstrates the ability to make significant improvements in function in a reasonable and predictable amount of time.     Precautions / Restrictions Precautions Precautions: Fall Required Braces or Orthoses: Other Brace Splint/Cast: LLE posterior splint Splint/Cast - Date Prophylactic Dressing Applied (if applicable): 07/25/22 Other Brace: camboot RLE Restrictions Weight Bearing Restrictions: Yes RLE Weight  Bearing: Weight bearing as tolerated LLE Weight Bearing: Non weight bearing      Mobility  Bed Mobility Overal bed mobility: Needs Assistance Bed Mobility: Supine to Sit     Supine to sit: Min guard     General bed mobility comments: for safety    Transfers Overall transfer level: Needs assistance Equipment used: Rolling walker (2 wheels) Transfers: Sit to/from Stand, Bed to chair/wheelchair/BSC Sit to Stand: Min assist Stand pivot transfers: Min assist         General transfer comment: cues for proper hand placement and NWB-LLE position; Stand pivot to [facility], BSC>chair    Ambulation/Gait                  Stairs            Wheelchair Mobility     Tilt Bed    Modified Rankin (Stroke Patients Only)       Balance Overall balance assessment: Needs assistance Sitting-balance support: No upper extremity supported, Feet unsupported Sitting balance-Leahy Scale: Fair     Standing balance support: Single extremity supported, During functional activity, Reliant on assistive device for balance Standing balance-Leahy Scale: Poor Standing balance comment: reliant on bil UEs for stand pivot                             Pertinent Vitals/Pain Pain Assessment Pain Assessment: 0-10 Pain Score: 3  Pain Location: L>R ankle Pain Descriptors / Indicators: Grimacing, Aching, Guarding, Pressure Pain Intervention(s): Limited activity within patient's tolerance, Monitored during session, Premedicated before session, Repositioned    Home Living Family/patient expects to  be discharged to:: Private residence Living Arrangements: Alone Available Help at Discharge: Family Type of Home: House Home Access: Ramped entrance (temporary ramp)       Home Layout: One level Home Equipment: Insurance risk surveyor (2 wheels);BSC/3in1;Shower seat Additional Comments: son works two jobs, unable to assist, parents came to stay however one has dementia and  other parent has "shoulder fx"    Prior Function Prior Level of Function : Independent/Modified Independent             Mobility Comments: IND prior to fall; using rolling chair  to mobilize in house       Hand Dominance        Extremity/Trunk Assessment   Upper Extremity Assessment Upper Extremity Assessment: Overall WFL for tasks assessed    Lower Extremity Assessment Lower Extremity Assessment: RLE deficits/detail;LLE deficits/detail RLE Deficits / Details: ankle ROM limited by pain and edema; able to df to neutral-NT further d/t incr pain; knee and hip grossly WFL LLE Deficits / Details: ankle NT d/t immobilization; knee and hip grossly WFL; able to move toes, popliteal block wearing off with sensation intact to light touch       Communication   Communication: No difficulties  Cognition Arousal/Alertness: Awake/alert Behavior During Therapy: WFL for tasks assessed/performed Overall Cognitive Status: Within Functional Limits for tasks assessed                                          General Comments      Exercises     Assessment/Plan    PT Assessment Patient needs continued PT services  PT Problem List Decreased strength;Decreased activity tolerance;Decreased balance;Decreased mobility;Decreased knowledge of precautions;Pain       PT Treatment Interventions DME instruction;Therapeutic exercise;Gait training;Functional mobility training;Therapeutic activities    PT Goals (Current goals can be found in the Care Plan section)  Acute Rehab PT Goals Patient Stated Goal: home if possible PT Goal Formulation: With patient Time For Goal Achievement: 08/02/22 Potential to Achieve Goals: Good    Frequency Min 1X/week     Co-evaluation               AM-PAC PT "6 Clicks" Mobility  Outcome Measure Help needed turning from your back to your side while in a flat bed without using bedrails?: A Little Help needed moving from lying on your  back to sitting on the side of a flat bed without using bedrails?: A Little Help needed moving to and from a bed to a chair (including a wheelchair)?: A Little Help needed standing up from a chair using your arms (e.g., wheelchair or bedside chair)?: A Little Help needed to walk in hospital room?: A Lot Help needed climbing 3-5 steps with a railing? : Total 6 Click Score: 15    End of Session Equipment Utilized During Treatment: Gait belt Activity Tolerance: Patient tolerated treatment well;Patient limited by pain Patient left: with call bell/phone within reach;in chair;with chair alarm set   PT Visit Diagnosis: Other abnormalities of gait and mobility (R26.89);Difficulty in walking, not elsewhere classified (R26.2)    Time: 7829-5621 PT Time Calculation (min) (ACUTE ONLY): 29 min   Charges:   PT Evaluation $PT Eval Low Complexity: 1 Low PT Treatments $Therapeutic Activity: 8-22 mins PT General Charges $$ ACUTE PT VISIT: 1 Visit         Delice Bison, PT  Acute Rehab Dept ([facility])  [phone]  07/26/2022   [facility] 07/26/2022, 1:44 PM

## 2022-07-26 NOTE — Plan of Care (Signed)

## 2022-07-26 NOTE — Anesthesia Postprocedure Evaluation (Signed)
Anesthesia Post Note  Patient: Katherine Harrison  Procedure(s) Performed: LEFT OPEN REDUCTION INTERNAL FIXATION (ORIF) ANKLE FRACTURE (Left: Ankle)     Patient location during evaluation: PACU Anesthesia Type: General Level of consciousness: awake and alert Pain management: pain level controlled Vital Signs Assessment: post-procedure vital signs reviewed and stable Respiratory status: spontaneous breathing, nonlabored ventilation, respiratory function stable and patient connected to nasal cannula oxygen Cardiovascular status: blood pressure returned to baseline and stable Postop Assessment: no apparent nausea or vomiting Anesthetic complications: no   No notable events documented.  Last Vitals:  Vitals:   07/26/22 0137 07/26/22 0542  BP: (!) 142/66 (!) 134/57  Pulse: 63 (!) 59  Resp: 16 18  Temp: (!) 36.4 C 36.9 C  SpO2: 94% 98%    Last Pain:  Vitals:   07/26/22 0542  TempSrc: Oral  PainSc:                  Kennieth Rad

## 2022-07-26 NOTE — Plan of Care (Signed)
  Problem: Coping: Goal: Level of anxiety will decrease Outcome: Progressing   Problem: Elimination: Goal: Will not experience complications related to bowel motility Outcome: Progressing Goal: Will not experience complications related to urinary retention Outcome: Progressing   Problem: Pain Managment: Goal: General experience of comfort will improve Outcome: Progressing   

## 2022-07-27 ENCOUNTER — Observation Stay (HOSPITAL_COMMUNITY): Payer: No Typology Code available for payment source

## 2022-07-27 DIAGNOSIS — S82852A Displaced trimalleolar fracture of left lower leg, initial encounter for closed fracture: Secondary | ICD-10-CM | POA: Diagnosis not present

## 2022-07-27 NOTE — Evaluation (Signed)
Occupational Therapy Evaluation Patient Details Name: Katherine Harrison MRN: [id number] DOB: March 21, 1965 Today's Date: 07/27/2022   History of Present Illness Pt brought to [facility] after fall while going up stairs at home on 07/19/22,  had immediate ankle pain and could not get up.  x-rays showed L  ankle fx She was reduced and plan was to d/c home for OP f/u but could not tolerate pain meds nor pain. pt elected for surgical management and underwent ORIF L ankle 07/25/22. PMH: bladder suspension, OA, claudication   Clinical Impression   Patient is currently requiring assistance with ADLs including up to moderate assist with seated or bed level Lower body ADLs, setup assist with Upper body ADLs,  as well as  min guard assist with bed mobility and up to moderate assist with functional transfers to [facility] using RW.   Current level of function is below patient's typical baseline.  During this evaluation, patient was limited by generalized weakness, impaired activity tolerance, pain and NWB to LLE as well as soft tissue injury and decreased tolerance for RLE WB, all of which has the potential to impact patient's safety and independence during functional mobility, as well as performance for ADLs.  Patient lives alone at home, and plan to have parents stay who are able to provide some level of supervision and very light assistance as well as take care of pt's home.  Patient demonstrates good rehab potential, and should benefit from continued skilled occupational therapy services while in acute care to maximize safety, independence and quality of life at home.  Continued occupational therapy services are recommended.  ?       Recommendations for follow up therapy are one component of a multi-disciplinary discharge planning process, led by the attending physician.  Recommendations may be updated based on patient status, additional functional criteria and insurance authorization.   Assistance Recommended at Discharge  Intermittent Supervision/Assistance  Patient can return home with the following A lot of help with walking and/or transfers;Two people to help with walking and/or transfers;A lot of help with bathing/dressing/bathroom    Functional Status Assessment  Patient has had a recent decline in their functional status and demonstrates the ability to make significant improvements in function in a reasonable and predictable amount of time.  Equipment Recommendations   (TBD)    Recommendations for Other Services Rehab consult     Precautions / Restrictions Precautions Precautions: Fall Required Braces or Orthoses: Other Brace Splint/Cast: LLE posterior splint Splint/Cast - Date Prophylactic Dressing Applied (if applicable): 07/25/22 Other Brace: camboot RLE Restrictions Weight Bearing Restrictions: Yes RLE Weight Bearing: Weight bearing as tolerated LLE Weight Bearing: Non weight bearing      Mobility Bed Mobility Overal bed mobility: Needs Assistance Bed Mobility: Supine to Sit     Supine to sit: Min guard, HOB elevated          Transfers                          Balance Overall balance assessment: Needs assistance Sitting-balance support: No upper extremity supported, Feet supported Sitting balance-Leahy Scale: Good     Standing balance support: During functional activity, Reliant on assistive device for balance, Bilateral upper extremity supported Standing balance-Leahy Scale: Poor                             ADL either performed or assessed with clinical judgement   ADL  Overall ADL's : Needs assistance/impaired Eating/Feeding: Independent;Sitting   Grooming: Wash/dry hands;Set up;Sitting   Upper Body Bathing: Set up;Sitting   Lower Body Bathing: Set up;Sitting/lateral leans   Upper Body Dressing : Sitting;Set up   Lower Body Dressing: Sitting/lateral leans;Bed level;Moderate assistance Lower Body Dressing Details (indicate cue type and  reason): Pt began donning RT CAM boot in long sitting but lacked flexibility to reach foot for straps. Tried at EOB but with increased pain and still struggled to reach straps on RLE for CAM boot. Toilet Transfer: Minimal assistance;Rolling walker (2 wheels);BSC/3in1;Stand-pivot;Moderate assistance Toilet Transfer Details (indicate cue type and reason): Pt stood from EOB to RW with Min As. Pt pivoted EOB to [facility] with Mod As.  Pt stood from [employer] with Min As and pivoted 180 degrees to recliner with Mod As. Toileting- Clothing Manipulation and Hygiene: Minimal assistance;Sit to/from stand Toileting - Clothing Manipulation Details (indicate cue type and reason): Anterior hygiene from [facility] with Min As for standing.     Functional mobility during ADLs: Minimal assistance;Moderate assistance;Rolling walker (2 wheels);Cueing for safety       Vision Baseline Vision/History: 1 Wears glasses Vision Assessment?: No apparent visual deficits     Perception     Praxis      Pertinent Vitals/Pain Pain Assessment Pain Assessment: Faces Pain Score: 3  Faces Pain Scale: Hurts even more (Once in dependent position) Pain Location: L>R ankle Pain Descriptors / Indicators: Grimacing, Aching, Guarding, Pressure Pain Intervention(s): Limited activity within patient's tolerance, Monitored during session, Premedicated before session, Repositioned, Ice applied     Hand Dominance Right   Extremity/Trunk Assessment Upper Extremity Assessment Upper Extremity Assessment: Overall WFL for tasks assessed (but arms tire quickly per pt)   Lower Extremity Assessment Lower Extremity Assessment: RLE deficits/detail;LLE deficits/detail;Defer to PT evaluation RLE Deficits / Details: ankle ROM limited by pain and edema; able to df to neutral-NT further d/t incr pain; knee and hip grossly WFL LLE Deficits / Details: ankle NT d/t immobilization; knee and hip grossly WFL; able to move toes, popliteal block wearing off with  sensation intact to light touch   Cervical / Trunk Assessment Cervical / Trunk Assessment: Normal   Communication Communication Communication: No difficulties   Cognition Arousal/Alertness: Awake/alert Behavior During Therapy: WFL for tasks assessed/performed Overall Cognitive Status: Within Functional Limits for tasks assessed                                       General Comments       Exercises     Shoulder Instructions      Home Living Family/patient expects to be discharged to:: Private residence Living Arrangements: Alone Available Help at Discharge: Family Type of Home: House Home Access: Ramped entrance     Home Layout: One level     Bathroom Shower/Tub: Producer, television/film/video: Handicapped height (Has one high toilet and one low toilet.) Bathroom Accessibility: No (Primary bathroom very small. Pt's office chair will not fit in either.)   Home Equipment: Insurance risk surveyor (2 wheels);BSC/3in1;Shower seat;Grab bars - toilet   Additional Comments: son works two jobs, unable to assist, parents came to stay however one has dementia and other parent has "shoulder fx".  Has adjustable bed.  Scooter is rented for 2 months.      Prior Functioning/Environment Prior Level of Function : Independent/Modified Independent  Mobility Comments: IND prior to fall; using rolling chair  to mobilize in house. Pt unsure if a manual WC will fit in her home. Would need a small, lightweight chair with leg lifts. ADLs Comments: Independent prior to fall.        OT Problem List: Pain;Impaired balance (sitting and/or standing);Decreased strength;Obesity;Decreased activity tolerance;Decreased knowledge of use of DME or AE      OT Treatment/Interventions: Self-care/ADL training;Therapeutic activities;Therapeutic exercise;Patient/family education;Balance training;DME and/or AE instruction    OT Goals(Current goals can be found in  the care plan section) Acute Rehab OT Goals Patient Stated Goal: "I need to be able to help myself at home. My parents can take care of the house. I need to take careo f me." OT Goal Formulation: With patient Time For Goal Achievement: 08/10/22 Potential to Achieve Goals: Good ADL Goals Pt Will Perform Lower Body Dressing: with set-up;with supervision;with adaptive equipment;sitting/lateral leans;bed level (Least restrictive position needed for opptimal safety) Pt Will Transfer to Toilet: with modified independence;stand pivot transfer;bedside commode Pt Will Perform Toileting - Clothing Manipulation and hygiene: with modified independence;with adaptive equipment;sitting/lateral leans;sit to/from stand Additional ADL Goal #1: Patient will tolerate BUE home exercise program 10-15 reps x 3 sets within in an unsupported seated position, in order to improve upper body strength, endurance and core stability needed to complete home ADLs. Additional ADL Goal #2: Patient will identify at least 3 fall prevention strategies to employ at home in order to maximize function and safety during ADLs and decrease caregiver burden while preventing possible injury and rehospitalization.  OT Frequency: Min 1X/week    Co-evaluation              AM-PAC OT "6 Clicks" Daily Activity     Outcome Measure Help from another person eating meals?: None Help from another person taking care of personal grooming?: A Little Help from another person toileting, which includes using toliet, bedpan, or urinal?: A Lot Help from another person bathing (including washing, rinsing, drying)?: A Lot Help from another person to put on and taking off regular upper body clothing?: A Little Help from another person to put on and taking off regular lower body clothing?: A Lot 6 Click Score: 16   End of Session Equipment Utilized During Treatment: Gait belt;Rolling walker (2 wheels) Nurse Communication: Mobility status  Activity  Tolerance: Patient limited by pain Patient left: with chair alarm set;with call bell/phone within reach;in chair  OT Visit Diagnosis: Pain;History of falling (Z91.81);Other abnormalities of gait and mobility (R26.89) Pain - Right/Left: Left Pain - part of body: Ankle and joints of foot (and RT)                Time: 0454-0981 OT Time Calculation (min): 27 min Charges:  OT General Charges $OT Visit: 1 Visit OT Evaluation $OT Eval Low Complexity: 1 Low OT Treatments $Self Care/Home Management : 8-22 mins  Victorino Dike, OT Acute Rehab Services Office: [phone] 07/27/2022  Theodoro Clock 07/27/2022, 10:19 AM

## 2022-07-27 NOTE — Progress Notes (Signed)
Orthopaedic Trauma Service Progress Note Holiday Coverage  Patient ID: Katherine Harrison MRN: [id number] DOB/AGE: [birth year] 56 y.o.  Subjective:  Doing ok this am  Ambulated about 71ft with therapy  Wearing cam boot on R leg but still having a lot of pain in R foot Maintaining NWB on left leg   Pt prefers to dc home as opposed to SNF    ROS As above  Objective:   VITALS:   Vitals:   07/26/22 2200 07/27/22 0131 07/27/22 0545 07/27/22 1336  BP: (!) 169/61 (!) 154/61 (!) 169/67 (!) 156/59  Pulse: 86 82 81 77  Resp: 18 17 17 17   Temp: 98.2 F (36.8 C) 99.1 F (37.3 C) 99 F (37.2 C) 98.1 F (36.7 C)  TempSrc: Oral Oral Oral   SpO2: 95% 95% 97% 95%  Weight:      Height:        Estimated body mass index is 34.49 kg/m as calculated from the following:   Height as of this encounter: 5\' 5"  (1.651 m).   Weight as of this encounter: 94 kg.   Intake/Output      07/03 0701 07/04 0700 07/04 0701 07/05 0700   P.O. 600 720   I.V. (mL/kg)     IV Piggyback 0    Total Intake(mL/kg) 600 (6.4) 720 (7.7)   Urine (mL/kg/hr) 3450 (1.5) 0 (0)   Stool 0 0   Blood     Total Output 3450 0   Net -2850 +720        Urine Occurrence 0 x 1 x   Stool Occurrence 0 x 0 x     LABS  No results found for this or any previous visit (from the past 24 hour(s)).   PHYSICAL EXAM:   Gen: awake, resting comfortably in bed, NAD, appears well, very pleasant  Lungs: unlabored Cardiac: reg Ext:       Left Lower extremity   SLS in place   Ext warm   Moderate swelling to foot   No pain with passive stretching of toes  DPN, SPN, TN sensation diminished  EHL, FHL, lesser toe motor intact        Right Lower Extremity   Moderate swelling of foot and ankle  Extensive ecchymosis to lateral foot and plantar aspect  Most pronounced tenderness lateral mid and forefoot. Mild tenderness over midfoot in general   Not all  that tender over ankle both medial and lateral   Ext warm   + DP pulse  Motor and sensory functions grossly intact   No pain out of proportion with passive stretching of toes   Assessment/Plan: 2 Days Post-Op     Anti-infectives (From admission, onward)    Start     Dose/Rate Route Frequency Ordered Stop   07/25/22 1630  ceFAZolin (ANCEF) IVPB 2g/100 mL premix        2 g 200 mL/hr over 30 Minutes Intravenous Every 6 hours 07/25/22 1355 07/26/22 0457   07/25/22 0830  ceFAZolin (ANCEF) IVPB 2g/100 mL premix        2 g 200 mL/hr over 30 Minutes Intravenous On call to O.R. 07/25/22 0820 07/25/22 1015     .  POD/HD#: 2  57 y/o female s/p fall with left trimalleolar ankle fracture dislocation and R foot/ankle sprain   -  fall  -L trimalleolar ankle fracture dislocation s/p ORIF including syndesmosis  Weightbearing NWB L leg--> use walker or crutches    ROM/Activity   Splint until office follow up then likely convert to Boot to allow ROM    Wound care   Do not remove splint     PT/OT  Ice and elevate   - R foot/ankle sprain   Xrays negative for obvious fracture   Given degree of foot swelling and ecchymosis think it would be prudent to obtain MRI to prove there is no injury including injury to lisfranc joint as pt is depending on this leg to mobilize with    Pt in agreement with plan   - Pain management:  Continue with current regimen   - ABL anemia/Hemodynamics  Stable  - Medical issues   Home meds  - DVT/PE prophylaxis:  Currently on lovenox   Dc on asa 81 mg BID x 30 days per surgical team  - ID:   Periop abx completed   - Metabolic Bone Disease:  Fracture suggestive of fragility fracture  Check vitamin d   Recommend outpt dexa  - Activity:  As above  - Dispo:  Continue therapies  Likely dc home tomorrow     Mearl Latin, PA-C [phone] (C) 07/27/2022, 5:41 PM  [facility] [address] [place]  [address] [phone] [phone] (F)    After 5pm and on the weekends please log on to Amion, go to orthopaedics and the look under the [facility] Call for the provider(s) on call. You can also call our office at [phone] and then follow the prompts to be connected to the call team.  Patient ID: Katherine Harrison, female   DOB: 06/22/1965, 57 y.o.   MRN: [id number]

## 2022-07-27 NOTE — TOC Progression Note (Signed)
Transition of Care [facility]) - Progression Note    Patient Details  Name: LAIYA TALLEY MRN: [id number] Date of Birth: Jan 12, 1966  [facility] CM/SW Contact  Amada Jupiter, LCSW Phone Number: 07/27/2022, 2:00 PM  Clinical Narrative:     Several discussions today with pt and therapy/ RN team and pt has made decision that she would like to plan for dc home instead of SNF.  Have confirmed with pt and mother that they have needed DME in the home.  PT notes good progress made with mobility and all in agreement with dc home.  Will plan for dc tomorrow if MD approves this and have requested the HHPT/OT orders be placed.  TOC wll follow up with pt in the morning.       Expected Discharge Plan and Services                                               Social Determinants of Health (SDOH) Interventions SDOH Screenings   Food Insecurity: No Food Insecurity (07/25/2022)  Housing: Low Risk  (07/25/2022)  Transportation Needs: No Transportation Needs (07/25/2022)  Utilities: Not At Risk (07/25/2022)  Tobacco Use: Medium Risk (07/26/2022)    Readmission Risk Interventions     No data to display

## 2022-07-27 NOTE — Progress Notes (Signed)
Physical Therapy Treatment Patient Details Name: Katherine Harrison MRN: [id number] DOB: 1965/11/10 Today's Date: 07/27/2022   History of Present Illness Pt brought to [facility] after fall while going up stairs at home on 07/19/22,  had immediate ankle pain and could not get up.  x-rays showed L  ankle fx She was reduced and plan was to d/c home for OP f/u but could not tolerate pain meds nor pain. pt elected for surgical management and underwent ORIF L ankle 07/25/22. PMH: bladder suspension, OA, claudication    PT Comments  Pt progressing well this session, incr amb distance, good adherence to NWB. Pt will benefit from PT in acute setting as well as f/u     Assistance Recommended at Discharge Intermittent Supervision/Assistance  If plan is discharge home, recommend the following:  Can travel by private vehicle    Assist for transportation;Help with stairs or ramp for entrance;Assistance with cooking/housework;A little help with bathing/dressing/bathroom   No  Equipment Recommendations  None recommended by PT    Recommendations for Other Services       Precautions / Restrictions Precautions Precautions: Fall Required Braces or Orthoses: Other Brace Splint/Cast: LLE posterior splint Splint/Cast - Date Prophylactic Dressing Applied (if applicable): 07/25/22 Other Brace: camboot RLE Restrictions Weight Bearing Restrictions: Yes RLE Weight Bearing: Weight bearing as tolerated LLE Weight Bearing: Non weight bearing     Mobility  Bed Mobility               General bed mobility comments: in recliner    Transfers Overall transfer level: Needs assistance Equipment used: Rolling walker (2 wheels) Transfers: Sit to/from Stand Sit to Stand: Min assist, Min guard           General transfer comment: cues for proper hand placement and NWB-LLE    Ambulation/Gait Ambulation/Gait assistance: Min assist, Min guard Gait Distance (Feet): 15 Feet Assistive device: Rolling walker (2  wheels)         General Gait Details: cues for sequence, RW position and NWB; pt domonstrates good adherence to NWB but does fatigue easily   Stairs             Wheelchair Mobility     Tilt Bed    Modified Rankin (Stroke Patients Only)       Balance           Standing balance support: During functional activity, Reliant on assistive device for balance, Bilateral upper extremity supported Standing balance-Leahy Scale: Poor Standing balance comment: reliant on bil UEs for stand pivot                            Cognition Arousal/Alertness: Awake/alert Behavior During Therapy: WFL for tasks assessed/performed Overall Cognitive Status: Within Functional Limits for tasks assessed                                          Exercises Other Exercises Other Exercises: R ankle ROM within pain free range Other Exercises: chair push ups x3    General Comments        Pertinent Vitals/Pain Pain Assessment Pain Assessment: Faces Faces Pain Scale: Hurts even more Pain Location: L>R ankle Pain Descriptors / Indicators: Grimacing, Aching, Guarding, Pressure Pain Intervention(s): Limited activity within patient's tolerance, Monitored during session, Premedicated before session, Repositioned, Ice applied    Home Living  Prior Function            PT Goals (current goals can now be found in the care plan section) Acute Rehab PT Goals Patient Stated Goal: home if possible PT Goal Formulation: With patient Time For Goal Achievement: 08/02/22 Potential to Achieve Goals: Good Progress towards PT goals: Progressing toward goals    Frequency    Min 1X/week      PT Plan Current plan remains appropriate    Co-evaluation              AM-PAC PT "6 Clicks" Mobility   Outcome Measure  Help needed turning from your back to your side while in a flat bed without using bedrails?: A Little Help  needed moving from lying on your back to sitting on the side of a flat bed without using bedrails?: A Little Help needed moving to and from a bed to a chair (including a wheelchair)?: A Little Help needed standing up from a chair using your arms (e.g., wheelchair or bedside chair)?: A Little Help needed to walk in hospital room?: A Little Help needed climbing 3-5 steps with a railing? : Total 6 Click Score: 16    End of Session Equipment Utilized During Treatment: Gait belt Activity Tolerance: Patient tolerated treatment well Patient left: with call bell/phone within reach;in chair;with chair alarm set;with family/visitor present Nurse Communication: Mobility status PT Visit Diagnosis: Other abnormalities of gait and mobility (R26.89);Difficulty in walking, not elsewhere classified (R26.2)     Time: 4098-1191 PT Time Calculation (min) (ACUTE ONLY): 16 min  Charges:    $Gait Training: 8-22 mins PT General Charges $$ ACUTE PT VISIT: 1 Visit                     Wladyslaw Henrichs, PT  Acute Rehab Dept ([facility]) [phone]  07/27/2022    [facility] 07/27/2022, 1:13 PM

## 2022-07-27 NOTE — NC FL2 (Signed)
[place] MEDICAID FL2 LEVEL OF CARE FORM     IDENTIFICATION  Patient Name: Katherine Harrison Birthdate: [birth year] Sex: female Admission Date (Current Location): 07/25/2022  [facility] and [place] Number:  Geophysical data processor and Address:  [facility],  [address], [place] [address]      Provider Number: [id number]  Attending Physician Name and Address:  Sheral Apley, MD  Relative Name and Phone Number:  Landis Martins (Son)  [phone] [facility])    Current Level of Care: Hospital Recommended Level of Care: Skilled Nursing Facility Prior Approval Number:    Date Approved/Denied:   PASRR Number: [id number]  Discharge Plan: SNF    Current Diagnoses: Patient Active Problem List   Diagnosis Date Noted   Severe sprain of right ankle 07/26/2022   Closed displaced trimalleolar fracture of left ankle 07/25/2022   SUI (stress urinary incontinence, female) 03/04/2015   Claudication (HCC) 10/13/2014    Orientation RESPIRATION BLADDER Height & Weight     Self, Time, Situation, Place  Normal Continent Weight: 207 lb 3.7 oz (94 kg) Height:  5\' 5"  (165.1 cm)  BEHAVIORAL SYMPTOMS/MOOD NEUROLOGICAL BOWEL NUTRITION STATUS      Continent Diet  AMBULATORY STATUS COMMUNICATION OF NEEDS Skin   Limited Assist Verbally Normal, Other (Comment) (Dry)                       Personal Care Assistance Level of Assistance  Bathing, Dressing Bathing Assistance: Limited assistance   Dressing Assistance: Limited assistance     Functional Limitations Info  Sight          SPECIAL CARE FACTORS FREQUENCY  PT (By licensed PT), OT (By licensed OT)     PT Frequency: 5 X week OT Frequency: 5 X week            Contractures Contractures Info: Not present    Additional Factors Info  Code Status Code Status Info: Full             Current Medications (07/27/2022):  This is the current hospital active medication list Current Facility-Administered  Medications  Medication Dose Route Frequency Provider Last Rate Last Admin   acetaminophen (TYLENOL) tablet 325-650 mg  325-650 mg Oral Q6H PRN Kathie Dike M, PA-C       bisacodyl (DULCOLAX) suppository 10 mg  10 mg Rectal Daily PRN Kathie Dike M, PA-C       diphenhydrAMINE (BENADRYL) 12.5 MG/5ML elixir 12.5-25 mg  12.5-25 mg Oral Q4H PRN Kathie Dike M, PA-C       docusate sodium (COLACE) capsule 100 mg  100 mg Oral BID Kathie Dike M, PA-C   100 mg at 07/27/22 0806   enoxaparin (LOVENOX) injection 40 mg  40 mg Subcutaneous Q24H Kathie Dike M, PA-C   40 mg at 07/27/22 2841   ferrous sulfate tablet 325 mg  325 mg Oral Daily Cecil Cobbs, [place]   325 mg at 07/27/22 3244   HYDROmorphone (DILAUDID) injection 0.5-1 mg  0.5-1 mg Intravenous Q4H PRN Kathie Dike M, PA-C       levothyroxine (SYNTHROID) tablet 137 mcg  137 mcg Oral Q0600 Cecil Cobbs, PA-C   137 mcg at 07/27/22 0531   methocarbamol (ROBAXIN) tablet 500 mg  500 mg Oral Q6H PRN Cecil Cobbs, PA-C   500 mg at 07/27/22 0102   Or   methocarbamol (ROBAXIN) 500 mg in dextrose 5 % 50 mL IVPB  500 mg Intravenous Q6H PRN  Kathie Dike M, PA-C       metoCLOPramide (REGLAN) tablet 5-10 mg  5-10 mg Oral Q8H PRN Kathie Dike M, PA-C       Or   metoCLOPramide (REGLAN) injection 5-10 mg  5-10 mg Intravenous Q8H PRN Kathie Dike M, PA-C       multivitamin with minerals tablet 1 tablet  1 tablet Oral Daily Cecil Cobbs, PA-C   1 tablet at 07/27/22 0806   ondansetron (ZOFRAN) tablet 4 mg  4 mg Oral Q6H PRN Cecil Cobbs, PA-C   4 mg at 07/26/22 1845   Or   ondansetron (ZOFRAN) injection 4 mg  4 mg Intravenous Q6H PRN Kathie Dike M, PA-C       oxyCODONE (Oxy IR/ROXICODONE) immediate release tablet 10-15 mg  10-15 mg Oral Q4H PRN Cecil Cobbs, PA-C   15 mg at 07/27/22 1610   oxyCODONE (Oxy IR/ROXICODONE) immediate release tablet 5-10 mg  5-10 mg Oral Q4H PRN  Cecil Cobbs, PA-C   10 mg at 07/26/22 9604   pantoprazole (PROTONIX) EC tablet 40 mg  40 mg Oral Daily Cecil Cobbs, PA-C   40 mg at 07/27/22 5409   polyethylene glycol (MIRALAX / GLYCOLAX) packet 17 g  17 g Oral Daily PRN Cecil Cobbs, PA-C   17 g at 07/26/22 8119   predniSONE (STERAPRED UNI-PAK 21 TAB) tablet 10 mg  10 mg Oral 3 x daily with food Jenne Pane, PA-C   10 mg at 07/27/22 0805   [START ON 07/28/2022] predniSONE (STERAPRED UNI-PAK 21 TAB) tablet 10 mg  10 mg Oral 4X daily taper Gawne, Meghan M, PA-C       predniSONE (STERAPRED UNI-PAK 21 TAB) tablet 20 mg  20 mg Oral Nightly Jenne Pane, PA-C         Discharge Medications: Please see discharge summary for a list of discharge medications.  Relevant Imaging Results:  Relevant Lab Results:   Additional Information [id number]  Catalina Gravel, LCSW

## 2022-07-28 DIAGNOSIS — S82852A Displaced trimalleolar fracture of left lower leg, initial encounter for closed fracture: Secondary | ICD-10-CM | POA: Diagnosis not present

## 2022-07-28 MED ORDER — ASPIRIN 81 MG PO TBEC: 81.0000 mg | DELAYED_RELEASE_TABLET | Freq: Two times a day (BID) | ORAL | 0 refills | Status: AC

## 2022-07-28 MED ORDER — ONDANSETRON 4 MG PO TBDP: 4.0000 mg | ORAL_TABLET | Freq: Three times a day (TID) | ORAL | 0 refills | Status: AC | PRN

## 2022-07-28 MED ORDER — MELOXICAM 15 MG PO TABS: 15.0000 mg | ORAL_TABLET | Freq: Every day | ORAL | 0 refills | Status: AC | PRN

## 2022-07-28 MED ORDER — ACETAMINOPHEN 500 MG PO TABS: 1000.0000 mg | ORAL_TABLET | Freq: Four times a day (QID) | ORAL | 0 refills | Status: AC | PRN

## 2022-07-28 MED ORDER — BACLOFEN 10 MG PO TABS: 10.0000 mg | ORAL_TABLET | Freq: Three times a day (TID) | ORAL | 0 refills | Status: DC | PRN

## 2022-07-28 MED ORDER — OXYCODONE HCL 5 MG PO TABS: 5.0000 mg | ORAL_TABLET | Freq: Four times a day (QID) | ORAL | 0 refills | Status: DC | PRN

## 2022-07-28 NOTE — Plan of Care (Signed)
  Problem: Education: Goal: Knowledge of General Education information will improve Description: Including pain rating scale, medication(s)/side effects and non-pharmacologic comfort measures 07/28/2022 1436 by Delila Spence, LPN Outcome: Adequate for Discharge 07/28/2022 1436 by Delila Spence, LPN Outcome: Progressing   Problem: Health Behavior/Discharge Planning: Goal: Ability to manage health-related needs will improve Outcome: Adequate for Discharge   Problem: Clinical Measurements: Goal: Ability to maintain clinical measurements within normal limits will improve Outcome: Adequate for Discharge Goal: Will remain free from infection Outcome: Adequate for Discharge Goal: Diagnostic test results will improve Outcome: Adequate for Discharge Goal: Respiratory complications will improve Outcome: Adequate for Discharge Goal: Cardiovascular complication will be avoided Outcome: Adequate for Discharge   Problem: Activity: Goal: Risk for activity intolerance will decrease 07/28/2022 1436 by Alice Rieger A, LPN Outcome: Adequate for Discharge 07/28/2022 1436 by Alice Rieger A, LPN Outcome: Progressing   Problem: Nutrition: Goal: Adequate nutrition will be maintained Outcome: Adequate for Discharge   Problem: Coping: Goal: Level of anxiety will decrease 07/28/2022 1436 by Alice Rieger A, LPN Outcome: Adequate for Discharge 07/28/2022 1436 by Delila Spence, LPN Outcome: Progressing   Problem: Elimination: Goal: Will not experience complications related to bowel motility Outcome: Adequate for Discharge Goal: Will not experience complications related to urinary retention Outcome: Adequate for Discharge   Problem: Pain Managment: Goal: General experience of comfort will improve Outcome: Adequate for Discharge   Problem: Safety: Goal: Ability to remain free from injury will improve Outcome: Adequate for Discharge   Problem: Skin Integrity: Goal: Risk for impaired skin  integrity will decrease Outcome: Adequate for Discharge

## 2022-07-28 NOTE — Discharge Summary (Signed)
Physician Discharge Summary  Patient ID: Katherine Harrison MRN: [id number] DOB/AGE: Oct 28, 1965 57 y.o.  Admit date: 07/25/2022 Discharge date: 07/28/2022  Admission Diagnoses: left ankle trimalleolar fracture and right ankle sprain  Discharge Diagnoses:  Principal Problem:   Closed displaced trimalleolar fracture of left ankle Active Problems:   Severe sprain of right ankle   Discharged Condition: fair  Hospital Course: Patient underwent a left ankle ORIF by Dr. Eulah Pont on 07/25/22 without complications and spent several nights in the hospital for pain control and mobilization. She has a concurrent severe right ankle sprain being treated non-operatively that complicates her mobility. She is now ready for discharge home with HHPT.     Consults: None  Significant Diagnostic Studies: n/a  Treatments: IV hydration, antibiotics: Ancef, analgesia: acetaminophen, Dilaudid, and Oxycodone, anticoagulation: Lovenox, steroids: prednisone, therapies: PT, OT, and SW, and surgery: left ankle ORIF  Discharge Exam: Blood pressure (!) 150/64, pulse 72, temperature 98 F (36.7 C), resp. rate 18, height 5\' 5"  (1.651 m), weight 94 kg, SpO2 97 %. General appearance: alert, cooperative, and no distress Head: Normocephalic, without obvious abnormality, atraumatic Resp: clear to auscultation bilaterally Cardio: regular rate and rhythm Extremities: left ankle in splint, right ankle in CAM boot Pulses:  L brachial 2+ R brachial 2+  L radial 2+ R radial 2+  L inguinal 2+ R inguinal 2+  L popliteal 2+ R popliteal 2+  L posterior tibial 2+ R posterior tibial 2+  L dorsalis pedis 2+ R dorsalis pedis 2+   Neurologic: Grossly normal Incision/Wound: splint c/d/i  Disposition: Discharge disposition: 06-Home-Health Care Svc       Discharge Instructions     Call MD / Call 911   Complete by: As directed    If you experience chest pain or shortness of breath, CALL 911 and be transported to the hospital  emergency room.  If you develope a fever above 101 F, pus (white drainage) or increased drainage or redness at the wound, or calf pain, call your surgeon's office.   Diet - low sodium heart healthy   Complete by: As directed    Driving restrictions   Complete by: As directed    No driving for 4-6 weeks   Non weight bearing   Complete by: As directed    Laterality: left   Extremity: Lower   Post-operative opioid taper instructions:   Complete by: As directed    POST-OPERATIVE OPIOID TAPER INSTRUCTIONS: It is important to wean off of your opioid medication as soon as possible. If you do not need pain medication after your surgery it is ok to stop day one. Opioids include: Codeine, Hydrocodone(Norco, Vicodin), Oxycodone(Percocet, oxycontin) and hydromorphone amongst others.  Long term and even short term use of opiods can cause: Increased pain response Dependence Constipation Depression Respiratory depression And more.  Withdrawal symptoms can include Flu like symptoms Nausea, vomiting And more Techniques to manage these symptoms Hydrate well Eat regular healthy meals Stay active Use relaxation techniques(deep breathing, meditating, yoga) Do Not substitute Alcohol to help with tapering If you have been on opioids for less than two weeks and do not have pain than it is ok to stop all together.  Plan to wean off of opioids This plan should start within one week post op of your joint replacement. Maintain the same interval or time between taking each dose and first decrease the dose.  Cut the total daily intake of opioids by one tablet each day Next start to increase the time between  doses. The last dose that should be eliminated is the evening dose.      Weight bearing as tolerated   Complete by: As directed    In boot   Laterality: right   Extremity: Lower      Allergies as of 07/28/2022       Reactions   Hydrocodone Nausea And Vomiting   Oxycodone Nausea And Vomiting         Medication List     TAKE these medications    acetaminophen 500 MG tablet Commonly known as: TYLENOL Take 2 tablets (1,000 mg total) by mouth every 6 (six) hours as needed for mild pain or moderate pain.   aspirin EC 81 MG tablet Take 1 tablet (81 mg total) by mouth 2 (two) times daily. To prevent blood clots for 30 days after surgery.   baclofen 10 MG tablet Commonly known as: LIORESAL Take 1 tablet (10 mg total) by mouth 3 (three) times daily as needed for muscle spasms.   ferrous sulfate 325 (65 FE) MG EC tablet Take 325 mg by mouth daily.   fluticasone 50 MCG/ACT nasal spray Commonly known as: FLONASE Place 1 spray into both nostrils at bedtime.   HYDROmorphone 2 MG tablet Commonly known as: DILAUDID Take 1 tablet (2 mg total) by mouth every 2 (two) hours as needed for severe pain.   levothyroxine 137 MCG tablet Commonly known as: SYNTHROID Take 137 mcg by mouth daily before breakfast.   meloxicam 15 MG tablet Commonly known as: MOBIC Take 1 tablet (15 mg total) by mouth daily as needed for pain (and inflammation).   multivitamin with minerals Tabs tablet Take 1 tablet by mouth daily.   ondansetron 4 MG disintegrating tablet Commonly known as: ZOFRAN-ODT Take 1 tablet (4 mg total) by mouth every 8 (eight) hours as needed for nausea or vomiting. What changed: Another medication with the same name was added. Make sure you understand how and when to take each.   ondansetron 4 MG disintegrating tablet Commonly known as: ZOFRAN-ODT Take 1 tablet (4 mg total) by mouth every 8 (eight) hours as needed for nausea or vomiting. What changed: You were already taking a medication with the same name, and this prescription was added. Make sure you understand how and when to take each.   oxyCODONE 5 MG immediate release tablet Commonly known as: Roxicodone Take 1 tablet (5 mg total) by mouth every 6 (six) hours as needed for severe pain.   Vitamin D-3 125 MCG (5000  UT) Tabs Take by mouth.               Discharge Care Instructions  (From admission, onward)           Start     Ordered   07/28/22 0000  Weight bearing as tolerated       Comments: In boot  Question Answer Comment  Laterality right   Extremity Lower      07/28/22 1431   07/28/22 0000  Non weight bearing       Question Answer Comment  Laterality left   Extremity Lower      07/28/22 1431            Follow-up Information     Sheral Apley, MD. Go on 08/04/2022.   Specialty: Orthopedic Surgery Why: at 11:30am Contact information: [address] [place] [address] [phone]  Signed: Jenne Pane PA-C 07/28/2022, 2:31 PM

## 2022-07-28 NOTE — Plan of Care (Signed)
  Problem: Education: Goal: Knowledge of General Education information will improve Description: Including pain rating scale, medication(s)/side effects and non-pharmacologic comfort measures Outcome: Progressing   Problem: Activity: Goal: Risk for activity intolerance will decrease Outcome: Progressing   Problem: Coping: Goal: Level of anxiety will decrease Outcome: Progressing   

## 2022-07-28 NOTE — Progress Notes (Signed)
Inpatient Rehab Admissions Coordinator:  ? ?Per therapy recommendations,  patient was screened for CIR candidacy by Alexzia Kasler, MS, CCC-SLP. At this time, Pt. Appears to be a a potential candidate for CIR. I will place   order for rehab consult per protocol for full assessment. Please contact me any with questions. ? ?Jasir Rother, MS, CCC-SLP ?Rehab Admissions Coordinator  ?[phone] (celll) ?[phone] (office) ? ?

## 2022-07-28 NOTE — TOC Transition Note (Signed)
Transition of Care [facility]) - CM/SW Discharge Note   Patient Details  Name: Katherine Harrison MRN: [id number] Date of Birth: 06/21/1965  Transition of Care [facility]) CM/SW Contact:  Amada Jupiter, LCSW Phone Number: 07/28/2022, 2:13 PM   Clinical Narrative:    Anticipating dc today.  Have been able to secure HHPT/OT services with [facility].  Pt has needed DME.  No further TOC needs.   Final next level of care: Home w Home Health Services Barriers to Discharge: Barriers Resolved   Patient Goals and CMS Choice      Discharge Placement                         Discharge Plan and Services Additional resources added to the After Visit Summary for                            HH Arranged: PT, OT HH Agency: [facility] Date HH Agency Contacted: 07/28/22 Time HH Agency Contacted: 1000 Representative spoke with at [facility] Agency: Morrie Sheldon  Social Determinants of Health (SDOH) Interventions SDOH Screenings   Food Insecurity: No Food Insecurity (07/25/2022)  Housing: Low Risk  (07/25/2022)  Transportation Needs: No Transportation Needs (07/25/2022)  Utilities: Not At Risk (07/25/2022)  Tobacco Use: Medium Risk (07/26/2022)     Readmission Risk Interventions     No data to display

## 2022-07-28 NOTE — Progress Notes (Signed)
Physical Therapy Treatment Patient Details Name: Katherine Harrison MRN: [id number] DOB: 10-06-1965 Today's Date: 07/28/2022   History of Present Illness Pt brought to [facility] after fall while going up stairs at home on 07/19/22,  had immediate ankle pain and could not get up.  x-rays showed L  ankle fx She was reduced and plan was to d/c home for OP f/u but could not tolerate pain meds nor pain. pt elected for surgical management and underwent ORIF L ankle 07/25/22. MRI revealing multiple nondisplaced R foot fxs 07/27/22 but no change to WB orders.  PMH: bladder suspension, OA, claudication.    PT Comments  Pt very cooperative and progressing well with mobility.  Pt requiring decreasing assist level for all basic task and up to ambulate increased distance in hall with good follow through on NWB.  Pt hopeful for dc home.     Assistance Recommended at Discharge Intermittent Supervision/Assistance  If plan is discharge home, recommend the following:  Can travel by private vehicle    Assist for transportation;Help with stairs or ramp for entrance;Assistance with cooking/housework;A little help with bathing/dressing/bathroom   No  Equipment Recommendations  None recommended by PT    Recommendations for Other Services       Precautions / Restrictions Precautions Precautions: Fall Required Braces or Orthoses: Other Brace Splint/Cast: LLE posterior splint Splint/Cast - Date Prophylactic Dressing Applied (if applicable): 07/25/22 Other Brace: camboot RLE Restrictions Weight Bearing Restrictions: Yes RLE Weight Bearing: Weight bearing as tolerated (with cam boot in place) LLE Weight Bearing: Non weight bearing     Mobility  Bed Mobility Overal bed mobility: Needs Assistance Bed Mobility: Supine to Sit     Supine to sit: Supervision     General bed mobility comments: No physical assist but use of bedrail    Transfers Overall transfer level: Needs assistance Equipment used: Rolling  walker (2 wheels) Transfers: Sit to/from Stand Sit to Stand: Min guard, Supervision           General transfer comment: cues for proper hand placement and NWB-LLE    Ambulation/Gait Ambulation/Gait assistance: Min guard, Supervision Gait Distance (Feet): 36 Feet Assistive device: Rolling walker (2 wheels)   Gait velocity: decr     General Gait Details: cues for sequence, RW position, improved UE control to limit R foot landing hard and NWB on L; pt demonstrates good adherence to NWB but does fatigue easily   Stairs             Wheelchair Mobility     Tilt Bed    Modified Rankin (Stroke Patients Only)       Balance Overall balance assessment: Needs assistance Sitting-balance support: No upper extremity supported, Feet supported Sitting balance-Leahy Scale: Good     Standing balance support: Reliant on assistive device for balance, Bilateral upper extremity supported Standing balance-Leahy Scale: Poor                              Cognition Arousal/Alertness: Awake/alert Behavior During Therapy: WFL for tasks assessed/performed Overall Cognitive Status: Within Functional Limits for tasks assessed                                          Exercises      General Comments        Pertinent Vitals/Pain Pain Assessment Pain Assessment:  0-10 Pain Score: 7  Pain Location: bil foot/ankle Pain Descriptors / Indicators: Grimacing, Aching, Guarding, Pressure Pain Intervention(s): Limited activity within patient's tolerance, Monitored during session, Premedicated before session, Ice applied    Home Living                          Prior Function            PT Goals (current goals can now be found in the care plan section) Acute Rehab PT Goals Patient Stated Goal: home PT Goal Formulation: With patient Time For Goal Achievement: 08/02/22 Potential to Achieve Goals: Good Progress towards PT goals: Progressing  toward goals    Frequency    Min 1X/week      PT Plan Current plan remains appropriate    Co-evaluation              AM-PAC PT "6 Clicks" Mobility   Outcome Measure  Help needed turning from your back to your side while in a flat bed without using bedrails?: A Little Help needed moving from lying on your back to sitting on the side of a flat bed without using bedrails?: A Little Help needed moving to and from a bed to a chair (including a wheelchair)?: A Little Help needed standing up from a chair using your arms (e.g., wheelchair or bedside chair)?: A Little Help needed to walk in hospital room?: A Little Help needed climbing 3-5 steps with a railing? : Total 6 Click Score: 16    End of Session Equipment Utilized During Treatment: Gait belt Activity Tolerance: Patient tolerated treatment well Patient left: with call bell/phone within reach;in chair;with chair alarm set;with family/visitor present Nurse Communication: Mobility status PT Visit Diagnosis: Other abnormalities of gait and mobility (R26.89);Difficulty in walking, not elsewhere classified (R26.2)     Time: 1610-9604 PT Time Calculation (min) (ACUTE ONLY): 25 min  Charges:    $Gait Training: 8-22 mins PT General Charges $$ ACUTE PT VISIT: 1 Visit                     Mauro Kaufmann PT Acute Rehabilitation Services Pager [phone] Office [phone]    Malgorzata Albert 07/28/2022, 12:22 PM

## 2022-08-06 ENCOUNTER — Observation Stay (HOSPITAL_COMMUNITY)
Admission: EM | Admit: 2022-08-06 | Discharge: 2022-08-07 | Disposition: A | Discharge status: Home / Self Care | Payer: No Typology Code available for payment source | Attending: Obstetrics and Gynecology | Admitting: Obstetrics and Gynecology

## 2022-08-06 ENCOUNTER — Emergency Department (HOSPITAL_COMMUNITY): Payer: No Typology Code available for payment source

## 2022-08-06 ENCOUNTER — Encounter (HOSPITAL_COMMUNITY): Payer: Self-pay

## 2022-08-06 DIAGNOSIS — K5900 Constipation, unspecified: Secondary | ICD-10-CM | POA: Diagnosis present

## 2022-08-06 DIAGNOSIS — Z79899 Other long term (current) drug therapy: Secondary | ICD-10-CM | POA: Diagnosis not present

## 2022-08-06 DIAGNOSIS — Z7982 Long term (current) use of aspirin: Secondary | ICD-10-CM | POA: Insufficient documentation

## 2022-08-06 DIAGNOSIS — E039 Hypothyroidism, unspecified: Secondary | ICD-10-CM | POA: Diagnosis not present

## 2022-08-06 DIAGNOSIS — S82852A Displaced trimalleolar fracture of left lower leg, initial encounter for closed fracture: Secondary | ICD-10-CM | POA: Diagnosis present

## 2022-08-06 LAB — COMPREHENSIVE METABOLIC PANEL
ALT: 55 U/L — ABNORMAL HIGH (ref 0–44)
AST: 61 U/L — ABNORMAL HIGH (ref 15–41)
Albumin: 4 g/dL (ref 3.5–5.0)
Alkaline Phosphatase: 111 U/L (ref 38–126)
Anion gap: 14 (ref 5–15)
BUN: 10 mg/dL (ref 6–20)
CO2: 15 mmol/L — ABNORMAL LOW (ref 22–32)
Calcium: 8.9 mg/dL (ref 8.9–10.3)
Chloride: 103 mmol/L (ref 98–111)
Creatinine, Ser: 0.71 mg/dL (ref 0.44–1.00)
GFR, Estimated: >60 mL/min (ref >60)
Glucose, Bld: 165 mg/dL — ABNORMAL HIGH (ref 70–99)
Potassium: 4.5 mmol/L (ref 3.5–5.1)
Sodium: 132 mmol/L — ABNORMAL LOW (ref 135–145)
Total Bilirubin: 2.1 mg/dL — ABNORMAL HIGH (ref 0.3–1.2)
Total Protein: 7.9 g/dL (ref 6.5–8.1)

## 2022-08-06 LAB — BASIC METABOLIC PANEL
Anion gap: 11 (ref 5–15)
BUN: 8 mg/dL (ref 6–20)
CO2: 19 mmol/L — ABNORMAL LOW (ref 22–32)
Calcium: 8.2 mg/dL — ABNORMAL LOW (ref 8.9–10.3)
Chloride: 105 mmol/L (ref 98–111)
Creatinine, Ser: 0.58 mg/dL (ref 0.44–1.00)
GFR, Estimated: >60 mL/min (ref >60)
Glucose, Bld: 136 mg/dL — ABNORMAL HIGH (ref 70–99)
Potassium: 3.8 mmol/L (ref 3.5–5.1)
Sodium: 135 mmol/L (ref 135–145)

## 2022-08-06 LAB — LIPASE, BLOOD: Lipase: 61 U/L — ABNORMAL HIGH (ref 11–51)

## 2022-08-06 LAB — CBC
HCT: 45.8 % (ref 36.0–46.0)
Hemoglobin: 14.8 g/dL (ref 12.0–15.0)
MCH: 29.1 pg (ref 26.0–34.0)
MCHC: 32.3 g/dL (ref 30.0–36.0)
MCV: 90 fL (ref 80.0–100.0)
Platelets: 275 10*3/uL (ref 150–400)
RBC: 5.09 MIL/uL (ref 3.87–5.11)
RDW: 13.7 % (ref 11.5–15.5)
WBC: 10.4 10*3/uL (ref 4.0–10.5)
nRBC: 0 % (ref 0.0–0.2)

## 2022-08-06 MED ORDER — ENOXAPARIN SODIUM 40 MG/0.4ML IJ SOSY
40.0000 mg | PREFILLED_SYRINGE | INTRAMUSCULAR | Status: DC
  Administered 2022-08-06: 40 mg via SUBCUTANEOUS
  Filled 2022-08-06: qty 0.4

## 2022-08-06 MED ORDER — LACTATED RINGERS IV BOLUS
1000.0000 mL | Freq: Once | INTRAVENOUS | Status: AC
  Administered 2022-08-06: 1000 mL via INTRAVENOUS

## 2022-08-06 MED ORDER — POTASSIUM CHLORIDE IN NACL 20-0.9 MEQ/L-% IV SOLN
INTRAVENOUS | Status: DC
  Filled 2022-08-06 (×3): qty 1000

## 2022-08-06 MED ORDER — SORBITOL 70 % SOLN
400.0000 mL | TOPICAL_OIL | Freq: Once | ORAL | Status: DC
  Filled 2022-08-06: qty 120

## 2022-08-06 MED ORDER — ONDANSETRON HCL 4 MG/2ML IJ SOLN
4.0000 mg | Freq: Once | INTRAMUSCULAR | Status: AC | PRN
  Administered 2022-08-06: 4 mg via INTRAVENOUS
  Filled 2022-08-06: qty 2

## 2022-08-06 MED ORDER — ACETAMINOPHEN 650 MG RE SUPP: 650.0000 mg | Freq: Four times a day (QID) | RECTAL | Status: DC | PRN

## 2022-08-06 MED ORDER — POLYETHYLENE GLYCOL 3350 17 G PO PACK
17.0000 g | PACK | Freq: Two times a day (BID) | ORAL | Status: DC
  Administered 2022-08-06: 17 g via ORAL
  Filled 2022-08-06 (×2): qty 1

## 2022-08-06 MED ORDER — MAGNESIUM CITRATE PO SOLN
0.5000 | Freq: Once | ORAL | Status: DC
  Filled 2022-08-06: qty 296

## 2022-08-06 MED ORDER — MAGNESIUM CITRATE PO SOLN
0.5000 | Freq: Once | ORAL | Status: AC
  Administered 2022-08-07: 0.5 via ORAL
  Filled 2022-08-06: qty 296

## 2022-08-06 MED ORDER — ONDANSETRON HCL 4 MG/2ML IJ SOLN: 4.0000 mg | Freq: Four times a day (QID) | INTRAMUSCULAR | Status: DC | PRN

## 2022-08-06 MED ORDER — BISACODYL 10 MG RE SUPP
10.0000 mg | Freq: Once | RECTAL | Status: AC
  Administered 2022-08-06: 10 mg via RECTAL
  Filled 2022-08-06: qty 1

## 2022-08-06 MED ORDER — ACETAMINOPHEN 325 MG PO TABS: 650.0000 mg | ORAL_TABLET | Freq: Four times a day (QID) | ORAL | Status: DC | PRN

## 2022-08-06 MED ORDER — IOHEXOL 300 MG/ML  SOLN
100.0000 mL | Freq: Once | INTRAMUSCULAR | Status: AC | PRN
  Administered 2022-08-06: 100 mL via INTRAVENOUS

## 2022-08-06 MED ORDER — ONDANSETRON HCL 4 MG PO TABS: 4.0000 mg | ORAL_TABLET | Freq: Four times a day (QID) | ORAL | Status: DC | PRN

## 2022-08-06 MED ORDER — MORPHINE SULFATE (PF) 4 MG/ML IV SOLN
4.0000 mg | Freq: Once | INTRAVENOUS | Status: AC
  Administered 2022-08-06: 4 mg via INTRAVENOUS
  Filled 2022-08-06: qty 1

## 2022-08-06 MED ORDER — SORBITOL 70 % SOLN
300.0000 mL | TOPICAL_OIL | Freq: Once | ORAL | Status: AC
  Administered 2022-08-07: 300 mL via RECTAL
  Filled 2022-08-06: qty 90

## 2022-08-06 NOTE — ED Provider Notes (Signed)
[place] EMERGENCY DEPARTMENT AT [facility] Provider Note   CSN: [id number] Arrival date & time: 08/06/22  1518     History  Chief Complaint  Patient presents with   Constipation   Abdominal Pain    Katherine Harrison is a 56 y.o. female.   Constipation 57 year old female presenting for constipation and abdominal pain.  She states for last 3 months she has had severe abdominal pain in her lower abdomen which is cramping and intermittent.  She has been constipated has only had a few small hard bowel movements.  She has tried MiraLAX at home multiple times and try to get an enema from her primary care provider but was not able to get this.  She had multiple sets of nonbloody bilious emesis.  No fevers or chest pain.  Occasionally has some difficulty urinating but no pain with urination or dysuria or hematuria.     Home Medications Prior to Admission medications   Medication Sig Start Date End Date Taking? Authorizing Provider  oxyCODONE-acetaminophen (PERCOCET/ROXICET) 5-325 MG tablet Take 1 tablet by mouth every 6 (six) hours as needed for severe pain.   Yes [provider]  acetaminophen (TYLENOL) 500 MG tablet Take 2 tablets (1,000 mg total) by mouth every 6 (six) hours as needed for mild pain or moderate pain. 07/28/22   Jenne Pane, PA-C  aspirin EC 81 MG tablet Take 1 tablet (81 mg total) by mouth 2 (two) times daily. To prevent blood clots for 30 days after surgery. 07/28/22   Jenne Pane, PA-C  baclofen (LIORESAL) 10 MG tablet Take 1 tablet (10 mg total) by mouth 3 (three) times daily as needed for muscle spasms. 07/28/22 07/28/23  Jenne Pane, PA-C  Cholecalciferol (VITAMIN D-3) 125 MCG (5000 UT) TABS Take by mouth.    [provider]  ferrous sulfate 325 (65 FE) MG EC tablet Take 325 mg by mouth daily.    [provider]  fluticasone (FLONASE) 50 MCG/ACT nasal spray Place 1 spray into both nostrils at bedtime.    [provider]  HYDROmorphone (DILAUDID) 2 MG tablet Take 1 tablet (2 mg total) by mouth every 2 (two) hours as needed for severe pain. 03/05/15   Levi Aland, MD  levothyroxine (SYNTHROID) 137 MCG tablet Take 137 mcg by mouth daily before breakfast. 05/25/22   [provider]  meloxicam (MOBIC) 15 MG tablet Take 1 tablet (15 mg total) by mouth daily as needed for pain (and inflammation). 07/28/22   Jenne Pane, PA-C  Multiple Vitamin (MULTIVITAMIN WITH MINERALS) TABS tablet Take 1 tablet by mouth daily.    [provider]  ondansetron (ZOFRAN-ODT) 4 MG disintegrating tablet Take 1 tablet (4 mg total) by mouth every 8 (eight) hours as needed for nausea or vomiting. 07/19/22   Gloris Manchester, MD  ondansetron (ZOFRAN-ODT) 4 MG disintegrating tablet Take 1 tablet (4 mg total) by mouth every 8 (eight) hours as needed for nausea or vomiting. 07/28/22   Jenne Pane, PA-C  oxyCODONE (ROXICODONE) 5 MG immediate release tablet Take 1 tablet (5 mg total) by mouth every 6 (six) hours as needed for severe pain. 07/28/22   Jenne Pane, PA-C      Allergies    Codeine, Hydrocodone, and Oxycodone    Review of Systems   Review of Systems  Gastrointestinal:  Positive for constipation.  Review of systems completed and notable as per HPI.  ROS otherwise negative.   Physical  Exam Updated Vital Signs BP (!) 156/55 (BP Location: Right Arm)   Pulse 80   Temp 98.3 F (36.8 C) (Oral)   Resp 18   SpO2 99%  Physical Exam Vitals and nursing note reviewed.  Constitutional:      General: She is in acute distress.     Appearance: She is well-developed.  HENT:     Head: Normocephalic and atraumatic.  Eyes:     Conjunctiva/sclera: Conjunctivae normal.  Cardiovascular:     Rate and Rhythm: Normal rate and regular rhythm.     Heart sounds: No murmur heard. Pulmonary:     Effort: Pulmonary effort is normal. No respiratory distress.     Breath sounds: Normal breath sounds.  Abdominal:     Palpations:  Abdomen is soft.     Tenderness: There is abdominal tenderness in the right lower quadrant, suprapubic area and left lower quadrant. There is no right CVA tenderness, left CVA tenderness, guarding or rebound.  Musculoskeletal:        General: No swelling.     Cervical back: Neck supple.  Skin:    General: Skin is warm and dry.     Capillary Refill: Capillary refill takes less than 2 seconds.  Neurological:     Mental Status: She is alert.  Psychiatric:        Mood and Affect: Mood normal.     ED Results / Procedures / Treatments   Labs (all labs ordered are listed, but only abnormal results are displayed) Labs Reviewed  LIPASE, BLOOD - Abnormal; Notable for the following components:      Result Value   Lipase 61 (*)    All other components within normal limits  COMPREHENSIVE METABOLIC PANEL - Abnormal; Notable for the following components:   Sodium 132 (*)    CO2 15 (*)    Glucose, Bld 165 (*)    AST 61 (*)    ALT 55 (*)    Total Bilirubin 2.1 (*)    All other components within normal limits  BASIC METABOLIC PANEL - Abnormal; Notable for the following components:   CO2 19 (*)    Glucose, Bld 136 (*)    Calcium 8.2 (*)    All other components within normal limits  CBC  COMPREHENSIVE METABOLIC PANEL  CBC WITH DIFFERENTIAL/PLATELET  MAGNESIUM  TSH    EKG None  Radiology CT ABDOMEN PELVIS W CONTRAST  Result Date: 08/06/2022 CLINICAL DATA:  Abdominal pain, constipation EXAM: CT ABDOMEN AND PELVIS WITH CONTRAST TECHNIQUE: Multidetector CT imaging of the abdomen and pelvis was performed using the standard protocol following bolus administration of intravenous contrast. RADIATION DOSE REDUCTION: This exam was performed according to the departmental dose-optimization program which includes automated exposure control, adjustment of the mA and/or kV according to patient size and/or use of iterative reconstruction technique. CONTRAST:  OMNIPAQUE IOHEXOL 300 MG/ML  SOLN  COMPARISON:  None Available. FINDINGS: Lower chest: No focal infiltrates are seen in lower lung fields. Small linear density lingula may suggest scarring or subsegmental atelectasis. Hepatobiliary: There is nodularity of the liver surface suggesting cirrhosis. There is fatty infiltration. Calcified gallbladder stones are seen. There is no wall thickening in gallbladder. Pancreas: No focal abnormalities are seen. Spleen: Spleen measures 12.8 cm in maximum diameter. Adrenals/Urinary Tract: Adrenals are unremarkable. There is no hydronephrosis. Small 2 mm calculus is seen in the upper pole left kidney. Ureters are not dilated. Urinary bladder is unremarkable. Stomach/Bowel: Stomach is not distended. Small bowel loops are  not dilated. Appendix is not dilated. There is no significant wall thickening in colon. There is no pericolic stranding. Vascular/Lymphatic: Scattered arterial calcifications are seen. Reproductive: Unremarkable. Other: There is no ascites or pneumoperitoneum. Small umbilical hernia containing fat is seen. Musculoskeletal: Posterior bony spurs are seen at the L5-S1 level. IMPRESSION: There is no evidence of intestinal obstruction or pneumoperitoneum. Appendix is not dilated. There is no hydronephrosis. Fatty liver. Cirrhosis of liver. Gallbladder stone. 2 mm left renal stone. Aortic atherosclerosis. Lumbar spondylosis. Other findings as described in the body of the report. Electronically Signed   By: Ernie Avena M.D.   On: 08/06/2022 17:29    Procedures Procedures    Medications Ordered in ED Medications  enoxaparin (LOVENOX) injection 40 mg (40 mg Subcutaneous Given 08/06/22 2200)  acetaminophen (TYLENOL) tablet 650 mg (has no administration in time range)    Or  acetaminophen (TYLENOL) suppository 650 mg (has no administration in time range)  ondansetron (ZOFRAN) tablet 4 mg (has no administration in time range)    Or  ondansetron (ZOFRAN) injection 4 mg (has no administration in  time range)  magnesium citrate solution 0.5 Bottle (0.5 Bottles Oral Not Given 08/06/22 2041)  0.9 % NaCl with KCl 20 mEq/ L  infusion ( Intravenous New Bag/Given 08/06/22 2203)  polyethylene glycol (MIRALAX / GLYCOLAX) packet 17 g (17 g Oral Given 08/06/22 2200)  ondansetron (ZOFRAN) injection 4 mg (4 mg Intravenous Given 08/06/22 1546)  morphine (PF) 4 MG/ML injection 4 mg (4 mg Intravenous Given 08/06/22 1555)  lactated ringers bolus 1,000 mL (0 mLs Intravenous Stopped 08/06/22 1709)  iohexol (OMNIPAQUE) 300 MG/ML solution 100 mL (100 mLs Intravenous Contrast Given 08/06/22 1636)  bisacodyl (DULCOLAX) suppository 10 mg (10 mg Rectal Given 08/06/22 1830)    ED Course/ Medical Decision Making/ A&P                             Medical Decision Making Amount and/or Complexity of Data Reviewed Labs: ordered. Radiology: ordered.  Risk OTC drugs. Prescription drug management. Decision regarding hospitalization.   Medical Decision Making:   Katherine Harrison is a 57 y.o. female who presented to the ED today with persistent constipation and abdominal pain.  Vital signs notable for hypertension and tachycardia.  She is in significant pain on my examination.  She has mild lower abdominal tenderness.  Most likely due to constipation however given vomiting and persistence obtain CT scan to rule out mass, obstruction.   Patient placed on continuous vitals and telemetry monitoring while in ED which was reviewed periodically.  Reviewed and confirmed nursing documentation for past medical history, family history, social history.  Initial Study Results:   Laboratory  All laboratory results reviewed.  Labs notable for anion gap metabolic acidosis improved on repeat.  Radiology:  All images reviewed independently.  Rectal stool ball, no other acute abnormality.  Agree with radiology report at this time.     Reassessment and Plan:   I attempted disimpaction twice.  Even with an enema, she did not have  significant improvement or constipation or pain.  Given this, I discussed the patient with the hospitalist and she was admitted for further management of severe constipation.   Patient's presentation is most consistent with acute complicated illness / injury requiring diagnostic workup.           Final Clinical Impression(s) / ED Diagnoses Final diagnoses:  Constipation, unspecified constipation type    Rx /  DC Orders ED Discharge Orders     None         Laurence Spates, MD 08/06/22 [phone]

## 2022-08-06 NOTE — Accreditation Note (Deleted)
PCP:   Daisy Floro, MD   Chief Complaint:  Constipation  HPI: This is a pleasant 57 year old female with PMHx of left ankle trimalleolar fracture and right ankle sprain.  Patient is sp left ankle ORIF by Dr. Eulah Pont on 07/25/22.  Per patient since before the accident she has not had a bowel movement, since approximately 6/25.  While inpatient she was treated with MiraLAX and Colace, she was discharged on those.  She was told it was use of pain medications.  Per patient she stopped her pain medication the day after discharge.  She is continue to not have a bowel movement, but has now developed severe abdominal pain.  Patient was unable to get home health assist with enemas ordered by physicians, she presented to the ER.  In ER patient was disimpacted twice by EDP.  Patient's pain improved but she still present.  Admission requested for continued care    Pcp ordered enema which cannot give. HHN requested. Too much for everyone  In excruciating pain.  Non wt bearing on LLE in cast. RLE in walking boot. Lives alone. Parents are helping  HHPT  HHOT  Stopped Fe on thursday  Review of Systems:  Per HPI  Past Medical History: Past Medical History:  Diagnosis Date   Cancer (HCC)    skin cancer   Claudication (HCC)    Deviated septum    Environmental allergies    Headache    migraine, visual aura-take aleve immediately to avoid migraines   Hypothyroidism    Muscle cramps    bilateral legs   Nasal turbinate hypertrophy    OA (osteoarthritis)    PONV (postoperative nausea and vomiting)    Past Surgical History:  Procedure Laterality Date   BLADDER SUSPENSION N/A 03/04/2015   Procedure: TRANSVAGINAL TAPE (TVT) PROCEDURE;  Surgeon: Levi Aland, MD;  Location: [facility];  Service: Gynecology;  Laterality: N/A;   CYSTOSCOPY N/A 03/04/2015   Procedure: CYSTOSCOPY;  Surgeon: Levi Aland, MD;  Location: [facility];  Service: Gynecology;  Laterality: N/A;   DILITATION &  CURRETTAGE/HYSTROSCOPY WITH HYDROTHERMAL ABLATION N/A 03/04/2015   Procedure: DILATATION & CURETTAGE/HYSTEROSCOPY WITH HYDROTHERMAL ABLATION;  Surgeon: Levi Aland, MD;  Location: [facility];  Service: Gynecology;  Laterality: N/A;   EYE SURGERY     eye muscle shortening surgery as toddler   ORIF ANKLE FRACTURE Left 07/25/2022   Procedure: LEFT OPEN REDUCTION INTERNAL FIXATION (ORIF) ANKLE FRACTURE;  Surgeon: Sheral Apley, MD;  Location: [facility];  Service: Orthopedics;  Laterality: Left;   TONSILLECTOMY     TUBAL LIGATION     WISDOM TOOTH EXTRACTION      Medications: Prior to Admission medications   Medication Sig Start Date End Date Taking? Authorizing Provider  oxyCODONE-acetaminophen (PERCOCET/ROXICET) 5-325 MG tablet Take 1 tablet by mouth every 6 (six) hours as needed for severe pain.   Yes [provider]  acetaminophen (TYLENOL) 500 MG tablet Take 2 tablets (1,000 mg total) by mouth every 6 (six) hours as needed for mild pain or moderate pain. 07/28/22   Jenne Pane, PA-C  aspirin EC 81 MG tablet Take 1 tablet (81 mg total) by mouth 2 (two) times daily. To prevent blood clots for 30 days after surgery. 07/28/22   Jenne Pane, PA-C  baclofen (LIORESAL) 10 MG tablet Take 1 tablet (10 mg total) by mouth 3 (three) times daily as needed for muscle spasms. 07/28/22 07/28/23  Jenne Pane, PA-C  Cholecalciferol (VITAMIN D-3)  125 MCG (5000 UT) TABS Take by mouth.    [provider]  ferrous sulfate 325 (65 FE) MG EC tablet Take 325 mg by mouth daily.    [provider]  fluticasone (FLONASE) 50 MCG/ACT nasal spray Place 1 spray into both nostrils at bedtime.    [provider]  HYDROmorphone (DILAUDID) 2 MG tablet Take 1 tablet (2 mg total) by mouth every 2 (two) hours as needed for severe pain. 03/05/15   Levi Aland, MD  levothyroxine (SYNTHROID) 137 MCG tablet Take 137 mcg by mouth daily before breakfast. 05/25/22   [provider]  meloxicam  (MOBIC) 15 MG tablet Take 1 tablet (15 mg total) by mouth daily as needed for pain (and inflammation). 07/28/22   Jenne Pane, PA-C  Multiple Vitamin (MULTIVITAMIN WITH MINERALS) TABS tablet Take 1 tablet by mouth daily.    [provider]  ondansetron (ZOFRAN-ODT) 4 MG disintegrating tablet Take 1 tablet (4 mg total) by mouth every 8 (eight) hours as needed for nausea or vomiting. 07/19/22   Gloris Manchester, MD  ondansetron (ZOFRAN-ODT) 4 MG disintegrating tablet Take 1 tablet (4 mg total) by mouth every 8 (eight) hours as needed for nausea or vomiting. 07/28/22   Jenne Pane, PA-C  oxyCODONE (ROXICODONE) 5 MG immediate release tablet Take 1 tablet (5 mg total) by mouth every 6 (six) hours as needed for severe pain. 07/28/22   Jenne Pane, PA-C    Allergies:   Allergies  Allergen Reactions   Codeine     Other Reaction(s): nv   Hydrocodone Nausea And Vomiting   Oxycodone Nausea And Vomiting    Social History:  reports that she quit smoking about 24 years ago. Her smoking use included cigarettes. She started smoking about 39 years ago. She has a 7.5 pack-year smoking history. She has never used smokeless tobacco. She reports current alcohol use. She reports that she does not use drugs.  Family History: History reviewed. No pertinent family history.  Physical Exam: Vitals:   08/06/22 1535 08/06/22 1600 08/06/22 1700 08/06/22 1900  BP: (!) 150/83 (!) 155/71 (!) 155/59   Pulse: (!) 109 86 80   Resp: 20 19 18    Temp: 98.3 F (36.8 C)   98.3 F (36.8 C)  SpO2: 99% 100% 98%     General:  A&O x3, well developed and nourished, no acute distress Eyes: Pink conjunctiva, no scleral icterus ENT: Moist oral mucosa, neck supple, no thyromegaly Lungs: CTA B/L,  no use of accessory muscles Cardiovascular: RRR. No carotid bruits, no JVD Abdomen: soft, positive BS, full nontender abdomen not an acute abdomen GU: not examined Neuro: CN II - XII grossly intact, sensation  intact Musculoskeletal: strength 5/5 all extremities, no clubbing, cyanosis or edema Skin: no rash, no subcutaneous crepitation, no decubitus Psych: appropriate patient  Labs on Admission:  Recent Labs    08/06/22 1537 08/06/22 1830  NA 132* 135  K 4.5 3.8  CL 103 105  CO2 15* 19*  GLUCOSE 165* 136*  BUN 10 8  CREATININE 0.71 0.58  CALCIUM 8.9 8.2*   Recent Labs    08/06/22 1537  AST 61*  ALT 55*  ALKPHOS 111  BILITOT 2.1*  PROT 7.9  ALBUMIN 4.0   Recent Labs    08/06/22 1537  LIPASE 61*   Recent Labs    08/06/22 1537  WBC 10.4  HGB 14.8  HCT 45.8  MCV 90.0  PLT 275  Radiological Exams on Admission: CT ABDOMEN PELVIS W CONTRAST  Result Date: 08/06/2022 CLINICAL DATA:  Abdominal pain, constipation EXAM: CT ABDOMEN AND PELVIS WITH CONTRAST TECHNIQUE: Multidetector CT imaging of the abdomen and pelvis was performed using the standard protocol following bolus administration of intravenous contrast. RADIATION DOSE REDUCTION: This exam was performed according to the departmental dose-optimization program which includes automated exposure control, adjustment of the mA and/or kV according to patient size and/or use of iterative reconstruction technique. CONTRAST:  OMNIPAQUE IOHEXOL 300 MG/ML  SOLN COMPARISON:  None Available. FINDINGS: Lower chest: No focal infiltrates are seen in lower lung fields. Small linear density lingula may suggest scarring or subsegmental atelectasis. Hepatobiliary: There is nodularity of the liver surface suggesting cirrhosis. There is fatty infiltration. Calcified gallbladder stones are seen. There is no wall thickening in gallbladder. Pancreas: No focal abnormalities are seen. Spleen: Spleen measures 12.8 cm in maximum diameter. Adrenals/Urinary Tract: Adrenals are unremarkable. There is no hydronephrosis. Small 2 mm calculus is seen in the upper pole left kidney. Ureters are not dilated. Urinary bladder is unremarkable. Stomach/Bowel:  Stomach is not distended. Small bowel loops are not dilated. Appendix is not dilated. There is no significant wall thickening in colon. There is no pericolic stranding. Vascular/Lymphatic: Scattered arterial calcifications are seen. Reproductive: Unremarkable. Other: There is no ascites or pneumoperitoneum. Small umbilical hernia containing fat is seen. Musculoskeletal: Posterior bony spurs are seen at the L5-S1 level. IMPRESSION: There is no evidence of intestinal obstruction or pneumoperitoneum. Appendix is not dilated. There is no hydronephrosis. Fatty liver. Cirrhosis of liver. Gallbladder stone. 2 mm left renal stone. Aortic atherosclerosis. Lumbar spondylosis. Other findings as described in the body of the report. Electronically Signed   By: Ernie Avena M.D.   On: 08/06/2022 17:29    Assessment/Plan Present on Admission:  Constipation -Will clean patient from above and below.  Patient unable to give herself an enema due to her lack of mobility. -Smog enema and mag citrate half bottle ordered -Patient to continue MiraLAX twice daily.  Senna held  left ankle ORIF by Dr. Eulah Pont on 07/25/22  -Patient with cast on LLE.  Nonweightbearing -Patient with walking boot on right -PT consult placed  Hypothyroidism -Synthroid resumed. -TSH ordered as this can contribute to constipation  Metabolic acidossis -Unclear etiology, improving with fluid hydration  Liver cirrhosis likely secondary to [place], Taneia Mealor 08/06/2022, 8:26 PMSmog enema

## 2022-08-06 NOTE — ED Triage Notes (Signed)
BIBA from home for constipation x 3 weeks, abdominal pain and nausea. Pt has tried laxatives, enemas and changing her diet with no relief. Pt also currently has BLE fractures. 136/70 BP 100 HR 24 rr 99% room air 155 cbg

## 2022-08-06 NOTE — ED Notes (Signed)
Patient transported to CT 

## 2022-08-07 DIAGNOSIS — K5903 Drug induced constipation: Secondary | ICD-10-CM | POA: Diagnosis not present

## 2022-08-07 DIAGNOSIS — E039 Hypothyroidism, unspecified: Secondary | ICD-10-CM | POA: Insufficient documentation

## 2022-08-07 LAB — COMPREHENSIVE METABOLIC PANEL
ALT: 46 U/L — ABNORMAL HIGH (ref 0–44)
AST: 37 U/L (ref 15–41)
Albumin: 3.6 g/dL (ref 3.5–5.0)
Alkaline Phosphatase: 106 U/L (ref 38–126)
Anion gap: 10 (ref 5–15)
BUN: 8 mg/dL (ref 6–20)
CO2: 22 mmol/L (ref 22–32)
Calcium: 8.4 mg/dL — ABNORMAL LOW (ref 8.9–10.3)
Chloride: 105 mmol/L (ref 98–111)
Creatinine, Ser: 0.72 mg/dL (ref 0.44–1.00)
GFR, Estimated: >60 mL/min (ref >60)
Glucose, Bld: 171 mg/dL — ABNORMAL HIGH (ref 70–99)
Potassium: 3.7 mmol/L (ref 3.5–5.1)
Sodium: 137 mmol/L (ref 135–145)
Total Bilirubin: 1.1 mg/dL (ref 0.3–1.2)
Total Protein: 7 g/dL (ref 6.5–8.1)

## 2022-08-07 LAB — MAGNESIUM: Magnesium: 2.3 mg/dL (ref 1.7–2.4)

## 2022-08-07 LAB — CBC WITH DIFFERENTIAL/PLATELET
Abs Immature Granulocytes: 0.04 10*3/uL (ref 0.00–0.07)
Basophils Absolute: 0.1 10*3/uL (ref 0.0–0.1)
Basophils Relative: 1 %
Eosinophils Absolute: 0 10*3/uL (ref 0.0–0.5)
Eosinophils Relative: 0 %
HCT: 40.1 % (ref 36.0–46.0)
Hemoglobin: 12.9 g/dL (ref 12.0–15.0)
Immature Granulocytes: 0 %
Lymphocytes Relative: 16 %
Lymphs Abs: 1.6 10*3/uL (ref 0.7–4.0)
MCH: 29.5 pg (ref 26.0–34.0)
MCHC: 32.2 g/dL (ref 30.0–36.0)
MCV: 91.8 fL (ref 80.0–100.0)
Monocytes Absolute: 0.9 10*3/uL (ref 0.1–1.0)
Monocytes Relative: 9 %
Neutro Abs: 7.5 10*3/uL (ref 1.7–7.7)
Neutrophils Relative %: 74 %
Platelets: 203 10*3/uL (ref 150–400)
RBC: 4.37 MIL/uL (ref 3.87–5.11)
RDW: 14 % (ref 11.5–15.5)
WBC: 10.2 10*3/uL (ref 4.0–10.5)
nRBC: 0 % (ref 0.0–0.2)

## 2022-08-07 LAB — TSH: TSH: 3.137 u[IU]/mL (ref 0.350–4.500)

## 2022-08-07 MED ORDER — POLYETHYLENE GLYCOL 3350 17 G PO PACK: 17.0000 g | PACK | Freq: Every day | ORAL | Status: AC

## 2022-08-07 MED ORDER — MELATONIN 5 MG PO TABS: 5.0000 mg | ORAL_TABLET | Freq: Every evening | ORAL | Status: DC | PRN

## 2022-08-07 MED ORDER — HYDROCHLOROTHIAZIDE 25 MG PO TABS: 25.0000 mg | ORAL_TABLET | Freq: Every day | ORAL | Status: DC

## 2022-08-07 MED ORDER — LEVOTHYROXINE SODIUM 25 MCG PO TABS
137.0000 ug | ORAL_TABLET | Freq: Every day | ORAL | Status: DC
  Administered 2022-08-07: 137 ug via ORAL
  Filled 2022-08-07: qty 1

## 2022-08-07 MED ORDER — AMLODIPINE BESYLATE 10 MG PO TABS: 10.0000 mg | ORAL_TABLET | Freq: Every day | ORAL | Status: DC

## 2022-08-07 NOTE — Plan of Care (Signed)

## 2022-08-07 NOTE — Discharge Summary (Signed)
Katherine Harrison QMV:[id number] DOB: Jun 11, 1965 DOA: 08/06/2022  PCP: Daisy Floro, MD  Admit date: 08/06/2022 Discharge date: 08/07/2022  Time spent: 35 minutes  Recommendations for Outpatient Follow-up:  Pcp f/u Attention to blood pressure and glucose at f/u     Discharge Diagnoses:  Principal Problem:   Constipation Active Problems:   Closed displaced trimalleolar fracture of left ankle   Hypothyroidism   Discharge Condition: improved  Diet recommendation: high fiber  There were no vitals filed for this visit.  History of present illness:  From admitting provider: This is a pleasant 57 year old female with PMHx of left ankle trimalleolar fracture and right ankle sprain.  Patient is sp left ankle ORIF by Dr. Eulah Pont on 07/25/22.  Per patient since before the accident she has not had a bowel movement, since approximately 6/25.  While inpatient she was treated with MiraLAX and Colace, she was discharged on those.  She was told it was use of pain medications.  Per patient she stopped her pain medication the day after discharge.  She is continue to not have a bowel movement, but has now developed severe abdominal pain.  Patient was unable to get home health assist with enemas ordered by physicians, she presented to the ER.   In ER patient was disimpacted twice by EDP.  Patient's pain improved but she still present.  Admission requested for continued care   Pcp ordered enema which cannot give. HHN requested. Too much for everyone   In excruciating pain.   Non wt bearing on LLE in cast. RLE in walking boot. Lives alone. Parents are helping  Hospital Course:  Hx ankle fracture s/p ORIF on 7/2. Last bowel movement 6/25. No longer taking opioids. Worsening abdominal pain this week. Here CT unremarkable. Disimpacted x2 in the ER and given enema x2. All that was successful, now passing liquid stool, abdominal pain resolved. Has miralax at home and will plan to continue that, titrate as  needed, with goal 1-2 soft formed BMs per day. Hx hypothyroid, here tsh wnl. Has outpt ortho f/u. Here bp elevated and so was glucose. Patient says she has a pcp and bp and glucose controlled as an outpatient. She declines starting meds prefers instead to arrange close pcp f/u. I have ordered an A1c that's pending at time of discharge.   Procedures: disimpaction   Consultations: none  Discharge Exam: Vitals:   08/07/22 0527 08/07/22 0847  BP: (!) 162/57 (!) 179/64  Pulse: 85 78  Resp: 18 17  Temp: 98.5 F (36.9 C) 98.6 F (37 C)  SpO2: 97% 97%    General: NAD Cardiovascular: RRR Respiratory: CTAB Abdomen: soft  Discharge Instructions   Discharge Instructions     Diet - low sodium heart healthy   Complete by: As directed    Increase activity slowly   Complete by: As directed       Allergies as of 08/07/2022       Reactions   Codeine Nausea And Vomiting   Hydrocodone Nausea And Vomiting   Oxycodone Nausea And Vomiting        Medication List     STOP taking these medications    baclofen 10 MG tablet Commonly known as: LIORESAL   ferrous sulfate 325 (65 FE) MG EC tablet   HYDROmorphone 2 MG tablet Commonly known as: DILAUDID   oxyCODONE 5 MG immediate release tablet Commonly known as: Roxicodone   oxyCODONE-acetaminophen 5-325 MG tablet Commonly known as: PERCOCET/ROXICET  TAKE these medications    acetaminophen 500 MG tablet Commonly known as: TYLENOL Take 2 tablets (1,000 mg total) by mouth every 6 (six) hours as needed for mild pain or moderate pain.   aspirin EC 81 MG tablet Take 1 tablet (81 mg total) by mouth 2 (two) times daily. To prevent blood clots for 30 days after surgery.   fluticasone 50 MCG/ACT nasal spray Commonly known as: FLONASE Place 1 spray into both nostrils at bedtime.   levothyroxine 137 MCG tablet Commonly known as: SYNTHROID Take 137 mcg by mouth daily before breakfast.   meloxicam 15 MG tablet Commonly  known as: MOBIC Take 1 tablet (15 mg total) by mouth daily as needed for pain (and inflammation).   multivitamin with minerals Tabs tablet Take 1 tablet by mouth daily.   ondansetron 4 MG disintegrating tablet Commonly known as: ZOFRAN-ODT Take 1 tablet (4 mg total) by mouth every 8 (eight) hours as needed for nausea or vomiting.   ondansetron 4 MG disintegrating tablet Commonly known as: ZOFRAN-ODT Take 1 tablet (4 mg total) by mouth every 8 (eight) hours as needed for nausea or vomiting.   Vitamin D-3 125 MCG (5000 UT) Tabs Take by mouth.               Durable Medical Equipment  (From admission, onward)           Start     Ordered   08/07/22 1029  DME Walker  Once       Question Answer Comment  Walker: With 5 Inch Wheels   Patient needs a walker to treat with the following condition Ankle fracture      08/07/22 1028           Allergies  Allergen Reactions   Codeine Nausea And Vomiting   Hydrocodone Nausea And Vomiting   Oxycodone Nausea And Vomiting    Follow-up Information     Daisy Floro, MD Follow up.   Specialty: Family Medicine Contact information: [address] [place] [address] [phone]                  The results of significant diagnostics from this hospitalization (including imaging, microbiology, ancillary and laboratory) are listed below for reference.    Significant Diagnostic Studies: CT ABDOMEN PELVIS W CONTRAST  Result Date: 08/06/2022 CLINICAL DATA:  Abdominal pain, constipation EXAM: CT ABDOMEN AND PELVIS WITH CONTRAST TECHNIQUE: Multidetector CT imaging of the abdomen and pelvis was performed using the standard protocol following bolus administration of intravenous contrast. RADIATION DOSE REDUCTION: This exam was performed according to the departmental dose-optimization program which includes automated exposure control, adjustment of the mA and/or kV according to patient size and/or use of iterative  reconstruction technique. CONTRAST:  OMNIPAQUE IOHEXOL 300 MG/ML  SOLN COMPARISON:  None Available. FINDINGS: Lower chest: No focal infiltrates are seen in lower lung fields. Small linear density lingula may suggest scarring or subsegmental atelectasis. Hepatobiliary: There is nodularity of the liver surface suggesting cirrhosis. There is fatty infiltration. Calcified gallbladder stones are seen. There is no wall thickening in gallbladder. Pancreas: No focal abnormalities are seen. Spleen: Spleen measures 12.8 cm in maximum diameter. Adrenals/Urinary Tract: Adrenals are unremarkable. There is no hydronephrosis. Small 2 mm calculus is seen in the upper pole left kidney. Ureters are not dilated. Urinary bladder is unremarkable. Stomach/Bowel: Stomach is not distended. Small bowel loops are not dilated. Appendix is not dilated. There is no significant wall thickening in colon. There  is no pericolic stranding. Vascular/Lymphatic: Scattered arterial calcifications are seen. Reproductive: Unremarkable. Other: There is no ascites or pneumoperitoneum. Small umbilical hernia containing fat is seen. Musculoskeletal: Posterior bony spurs are seen at the L5-S1 level. IMPRESSION: There is no evidence of intestinal obstruction or pneumoperitoneum. Appendix is not dilated. There is no hydronephrosis. Fatty liver. Cirrhosis of liver. Gallbladder stone. 2 mm left renal stone. Aortic atherosclerosis. Lumbar spondylosis. Other findings as described in the body of the report. Electronically Signed   By: Ernie Avena M.D.   On: 08/06/2022 17:29   DG Ankle Complete Left  Result Date: 07/27/2022 CLINICAL DATA:  Fracture EXAM: LEFT ANKLE COMPLETE - 3+ VIEW COMPARISON:  CT ankle dated 07/19/2022 FINDINGS: Overlying splint obscures fine osseous detail. Oblique distal fibular fracture, status post ORIF, without evidence of hardware complication. Nondisplaced medial malleolar fracture. Known posterior malleolar fracture is not  visualized, obscured by the fibular plate. Syndesmotic fixation.  Tibial plateau screw. Mild irregularity of the calcaneus on the lateral view, although this is favored to be related to the overlying splint as this was not present on prior CT. IMPRESSION: Status post ORIF of the patient's known trimalleolar fractures, as above. Electronically Signed   By: Charline Bills M.D.   On: 07/27/2022 20:09   MR FOOT RIGHT WO CONTRAST  Result Date: 07/27/2022 CLINICAL DATA:  Foot trauma evaluate for occult fracture. EXAM: MRI OF THE RIGHT FOREFOOT WITHOUT CONTRAST TECHNIQUE: Multiplanar, multisequence MR imaging of the right forefoot was performed. No intravenous contrast was administered. COMPARISON:  None Available. FINDINGS: Bones/Joint/Cartilage Acute nondisplaced fracture of the medial aspect of the navicular extending to the articular surface of the talonavicular joint. Subtle impaction fracture of the distal medial head of the talus adjacent to the talonavicular joint. Nondisplaced fracture of the proximal medial and distal lateral cuboid with associated marrow edema. Mild osseous contusion of the distal lateral aspect of the calcaneus at the calcaneocuboid articulation. Mild marrow edema in the anterior process of the calcaneus which is not well visualized which may reflect a small nondisplaced fracture versus an osseous contusion. Mild osteoarthritis of the talonavicular joint. Normal alignment. No joint effusion. Ligaments Collateral ligaments are intact.  Lisfranc ligament is intact. Muscles and Tendons Flexor, peroneal and extensor compartment tendons are intact. Muscles are normal. Soft tissue No fluid collection or hematoma. No soft tissue mass. Soft tissue edema along the dorsal aspect of the foot primarily along the lateral half likely reflecting a hemorrhagic contusion. IMPRESSION: 1. Acute nondisplaced fracture of the medial aspect of the navicular extending to the articular surface of the  talonavicular joint. 2. Subtle impaction fracture of the distal medial head of the talus adjacent to the talonavicular joint. 3. Nondisplaced fracture of the proximal medial and distal lateral cuboid with associated marrow edema. 4. Mild osseous contusion of the distal lateral aspect of the calcaneus at the calcaneocuboid articulation. Mild marrow edema in the anterior process of the calcaneus which is not well visualized which may reflect a small nondisplaced fracture versus an osseous contusion. 5. Hemorrhagic contusion along the dorsal lateral foot. Electronically Signed   By: Elige Ko M.D.   On: 07/27/2022 18:55   CT Ankle Left Wo Contrast  Result Date: 07/19/2022 CLINICAL DATA:  Complex ankle fractures. EXAM: CT OF THE LEFT ANKLE WITHOUT CONTRAST TECHNIQUE: Multidetector CT imaging of the left ankle was performed according to the standard protocol. Multiplanar CT image reconstructions were also generated. RADIATION DOSE REDUCTION: This exam was performed according to the departmental dose-optimization  program which includes automated exposure control, adjustment of the mA and/or kV according to patient size and/or use of iterative reconstruction technique. COMPARISON:  Radiographs, same date. FINDINGS: Complex comminuted ankle fractures as demonstrated on the radiographs. There is an oblique fracture through the base of the medial malleolus with mild impression of approximately 3.5 mm. Comminuted fracture involving the posterior malleolus of the tibia with articular step-off measuring approximately 3 mm and 3 mm of depression at the articular surface. There is also an avulsion fracture from the distal tip of the medial malleolus and associated medial mortise widening. Severely comminuted and complex fracture involving the entire distal shaft of the fibula including the lateral malleolus. Multiple butterfly type displaced fragments are noted. The talus is intact. Few tiny fracture fragments are noted in  the tibiotalar joint. The subtalar joints are maintained. The sinus tarsi is unremarkable. No visualized midfoot or hindfoot fractures. IMPRESSION: 1. Complex comminuted and displaced intra-articular trimalleolar ankle fractures as described above. 2. Medial mortise widening. 3. No visualized midfoot or hindfoot fractures. Electronically Signed   By: Rudie Meyer M.D.   On: 07/19/2022 10:34   DG Ankle 2 Views Right  Result Date: 07/19/2022 CLINICAL DATA:  Fall EXAM: RIGHT ANKLE - 2 VIEW COMPARISON:  None Available. FINDINGS: There is no evidence of fracture, dislocation, or joint effusion. There is no evidence of arthropathy or other focal bone abnormality. Soft tissues swelling about the ankle and foot. IMPRESSION: 1. No acute fracture or dislocation. 2. Soft tissue swelling about the ankle and foot. Electronically Signed   By: Larose Hires D.O.   On: 07/19/2022 10:04   DG Pelvis Portable  Result Date: 07/19/2022 CLINICAL DATA:  Fall down 2 steps. EXAM: PORTABLE PELVIS 1-2 VIEWS COMPARISON:  None Available. FINDINGS: Single frontal view of the pelvis. Mild-to-moderate bilateral femoroacetabular joint space narrowing. Within the limitations of mild obliquity of the bilateral femoral head-neck junctions, no definite proximal femoral acute fracture is seen. Mild-to-moderate bilateral inferior sacroiliac joint space narrowing and subchondral sclerosis. Mild pubic symphysis joint space narrowing. No acute fracture is seen. 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 No acute fracture is seen. Electronically Signed   By: Neita Garnet M.D.   On: 07/19/2022 09:26   DG Ankle 2 Views Left  Result Date: 07/19/2022 CLINICAL DATA:  Fall.  Rolled both ankles.  Landed on both ankles. EXAM: LEFT ANKLE - 2 VIEW; LEFT FOOT - 2 VIEW COMPARISON:  Left ankle and foot radiographs 03/24/2019 and 08/27/2010 FINDINGS: Left ankle: There is a markedly comminuted fracture of the distal fibular  diaphysis and metaphysis along an approximate 6.5 cm craniocaudal length with moderate fracture line diastasis, mild-to-moderate lateral apex angulation, and moderate anterior apex angulation of the fracture. There is posterior dislocation of the talar dome with respect to the distal tibial plafond. There is an associated vertical oriented fracture of the posterior malleolus with approximately 2 cm posterior displacement of the posterior fracture component and also superior displacement of the posterior fracture component, resting on the posterior aspect of the talar dome on lateral view. There is an acute fracture of the superior aspect of the medial malleolus with approximately 9 mm medial displacement of the distal fracture component with respect to the proximal fracture component. Moderate calcaneocuboid joint space narrowing and peripheral osteophytosis. Bone-on-bone contact of the talonavicular joint on lateral view. Tiny plantar calcaneal heel spur. Multiple small chronic ossicles at the Achilles insertion on the calcaneus. -- Left foot: Moderate lateral and mild medial great toe metatarsophalangeal  joint space narrowing with moderate lateral degenerative osteophytosis. Moderate to severe second through fifth interphalangeal joint space narrowing. IMPRESSION: 1. Acute markedly comminuted fracture of the distal fibular diaphysis and metaphysis with moderate fracture line diastasis, mild-to-moderate lateral apex angulation, and moderate anterior apex angulation of the fracture. 2. Acute displaced fracture of the posterior malleolus with associated posterior dislocation of the talar dome with respect to the distal tibial plafond. 3. Acute displaced fracture of the superior aspect of the medial malleolus. Electronically Signed   By: Neita Garnet M.D.   On: 07/19/2022 09:25   DG Foot 2 Views Left  Result Date: 07/19/2022 CLINICAL DATA:  Fall.  Rolled both ankles.  Landed on both ankles. EXAM: LEFT ANKLE - 2  VIEW; LEFT FOOT - 2 VIEW COMPARISON:  Left ankle and foot radiographs 03/24/2019 and 08/27/2010 FINDINGS: Left ankle: There is a markedly comminuted fracture of the distal fibular diaphysis and metaphysis along an approximate 6.5 cm craniocaudal length with moderate fracture line diastasis, mild-to-moderate lateral apex angulation, and moderate anterior apex angulation of the fracture. There is posterior dislocation of the talar dome with respect to the distal tibial plafond. There is an associated vertical oriented fracture of the posterior malleolus with approximately 2 cm posterior displacement of the posterior fracture component and also superior displacement of the posterior fracture component, resting on the posterior aspect of the talar dome on lateral view. There is an acute fracture of the superior aspect of the medial malleolus with approximately 9 mm medial displacement of the distal fracture component with respect to the proximal fracture component. Moderate calcaneocuboid joint space narrowing and peripheral osteophytosis. Bone-on-bone contact of the talonavicular joint on lateral view. Tiny plantar calcaneal heel spur. Multiple small chronic ossicles at the Achilles insertion on the calcaneus. -- Left foot: Moderate lateral and mild medial great toe metatarsophalangeal joint space narrowing with moderate lateral degenerative osteophytosis. Moderate to severe second through fifth interphalangeal joint space narrowing. IMPRESSION: 1. Acute markedly comminuted fracture of the distal fibular diaphysis and metaphysis with moderate fracture line diastasis, mild-to-moderate lateral apex angulation, and moderate anterior apex angulation of the fracture. 2. Acute displaced fracture of the posterior malleolus with associated posterior dislocation of the talar dome with respect to the distal tibial plafond. 3. Acute displaced fracture of the superior aspect of the medial malleolus. Electronically Signed   By:  Neita Garnet M.D.   On: 07/19/2022 09:25   DG Chest Portable 1 View  Result Date: 07/19/2022 CLINICAL DATA:  Fall. EXAM: PORTABLE CHEST 1 VIEW COMPARISON:  None Available. FINDINGS: Cardiac silhouette and mediastinal contours are within limits. The lungs are clear. No pleural effusion or pneumothorax. No acute skeletal abnormality. IMPRESSION: No acute cardiopulmonary disease process. Electronically Signed   By: Neita Garnet M.D.   On: 07/19/2022 09:18   DG Foot 2 Views Right  Result Date: 07/19/2022 CLINICAL DATA:  Fall.  Missed a step and rolled both ankles.  Pain. EXAM: RIGHT FOOT - 2 VIEW COMPARISON:  None Available. FINDINGS: Mild-to-moderate great toe metatarsophalangeal joint space narrowing with mild lateral degenerative osteophytosis. Moderate to high-grade second and third and moderate fourth and fifth interphalangeal joint space narrowing. Mild lateral great toe interphalangeal joint space narrowing and peripheral degenerative spurring. Mild chronic enthesopathic change at the Achilles and plantar fascia insertions on the calcaneus. Moderate to severe talonavicular joint space narrowing on lateral view. There is a grouping of tiny mineralized foci overall measuring up to 3 mm just dorsal to the tarsometatarsal joints on  lateral view. There is mild-to-moderate soft tissue swelling just dorsal to this region. IMPRESSION: There is a grouping of tiny mineralized foci overall measuring up to 3 mm just dorsal to the tarsometatarsal joints on lateral view. There is mild-to-moderate soft tissue swelling just dorsal to this region, and this is suspicious for an acute avulsion injury. Electronically Signed   By: Neita Garnet M.D.   On: 07/19/2022 09:17    Microbiology: No results found for this or any previous visit (from the past 240 hour(s)).   Labs: Basic Metabolic Panel: Recent Labs  Lab 08/06/22 1537 08/06/22 1830 08/07/22 0434  NA 132* 135 137  K 4.5 3.8 3.7  CL 103 105 105  CO2 15*  19* 22  GLUCOSE 165* 136* 171*  BUN 10 8 8   CREATININE 0.71 0.58 0.72  CALCIUM 8.9 8.2* 8.4*  MG  --   --  2.3   Liver Function Tests: Recent Labs  Lab 08/06/22 1537 08/07/22 0434  AST 61* 37  ALT 55* 46*  ALKPHOS 111 106  BILITOT 2.1* 1.1  PROT 7.9 7.0  ALBUMIN 4.0 3.6   Recent Labs  Lab 08/06/22 1537  LIPASE 61*   No results for input(s): "AMMONIA" in the last 168 hours. CBC: Recent Labs  Lab 08/06/22 1537 08/07/22 0434  WBC 10.4 10.2  NEUTROABS  --  7.5  HGB 14.8 12.9  HCT 45.8 40.1  MCV 90.0 91.8  PLT 275 203   Cardiac Enzymes: No results for input(s): "CKTOTAL", "CKMB", "CKMBINDEX", "TROPONINI" in the last 168 hours. BNP: BNP (last 3 results) No results for input(s): "BNP" in the last 8760 hours.  ProBNP (last 3 results) No results for input(s): "PROBNP" in the last 8760 hours.  CBG: No results for input(s): "GLUCAP" in the last 168 hours.     Signed:  Silvano Bilis MD.  [facility] 08/07/2022, 10:28 AM

## 2022-08-07 NOTE — TOC Transition Note (Signed)
Transition of Care [facility]) - CM/SW Discharge Note  Patient Details  Name: Katherine Harrison MRN: [id number] Date of Birth: October 19, 1965  [facility] CM/SW Contact:  Ewing Schlein, LCSW Phone Number: 08/07/2022, 2:20 PM  Clinical Narrative: Patient will need a rolling walker and PT evaluation recommended patient resume HHPT/OT. CSW spoke with patient regarding recommendations and is agreeable to [employer] delivering a rolling walker to her room. CSW made DME referral to Zack with [employer]. [employer] to deliver rolling walker to patient's room. CSW notified Morrie Sheldon with [facility] that new HH orders have been placed. Patient updated. TOC signing off.  Final next level of care: Home w Home Health Services Barriers to Discharge: Barriers Resolved  Patient Goals and CMS Choice CMS Medicare.gov Compare Post Acute Care list provided to:: Patient Choice offered to / list presented to : Patient  Discharge Plan and Services Additional resources added to the After Visit Summary for          DME Arranged: Walker rolling DME Agency: [facility] Date DME Agency Contacted: 08/07/22 Time DME Agency Contacted: 1114 Representative spoke with at DME Agency: Zack HH Arranged: PT, OT HH Agency: [facility] ([facility] Date HH Agency Contacted: 08/07/22 Time HH Agency Contacted: [phone] Representative spoke with at [facility] Agency: Morrie Sheldon  Social Determinants of Health (SDOH) Interventions SDOH Screenings   Food Insecurity: No Food Insecurity (08/07/2022)  Housing: Low Risk  (08/07/2022)  Transportation Needs: No Transportation Needs (08/07/2022)  Utilities: Not At Risk (08/07/2022)  Tobacco Use: Medium Risk (08/06/2022)   Readmission Risk Interventions     No data to display

## 2022-08-07 NOTE — Evaluation (Signed)
Physical Therapy Evaluation Patient Details Name: Katherine Harrison MRN: [id number] DOB: [birth year] Today's Date: 08/07/2022  History of Present Illness  57 year old female with PMHx of left ankle trimalleolar fracture and right ankle sprain.  Patient is sp left ankle ORIF by Dr. Eulah Pont on 07/25/22.  Per patient since before the accident she has not had a bowel movement, since approximately 6/25 PMH: bladder suspension, OA, caludication  Clinical Impression  Patient evaluated by Physical Therapy with no further acute PT needs identified. All education has been completed and the patient has no further questions.  Pt known to me from previous admission; pt remains motivated to return to independence; will benefit from continued HHPT/HHOT at d/c   See below for any follow-up Physical Therapy or equipment needs. PT is signing off. Thank you for this referral.         Assistance Recommended at Discharge Intermittent Supervision/Assistance  If plan is discharge home, recommend the following:  Can travel by private vehicle  Assist for transportation;Help with stairs or ramp for entrance;Assistance with cooking/housework;A little help with bathing/dressing/bathroom        Equipment Recommendations None recommended by PT  Recommendations for Other Services       Functional Status Assessment Patient has had a recent decline in their functional status and demonstrates the ability to make significant improvements in function in a reasonable and predictable amount of time.     Precautions / Restrictions Precautions Precautions: Fall Required Braces or Orthoses: Other Brace;Splint/Cast Splint/Cast: L lower leg cast Other Brace: camboot RLE Restrictions Weight Bearing Restrictions: Yes RLE Weight Bearing: Weight bearing as tolerated LLE Weight Bearing: Non weight bearing      Mobility  Bed Mobility Overal bed mobility: Needs Assistance Bed Mobility: Supine to Sit     Supine to sit:  Supervision, Modified independent (Device/Increase time)     General bed mobility comments: No physical assist but use of bedrail    Transfers Overall transfer level: Needs assistance Equipment used: Rolling walker (2 wheels) Transfers: Sit to/from Stand Sit to Stand: Min guard, Supervision           General transfer comment: cues for proper hand placement and good adherence to NWB-LLE    Ambulation/Gait Ambulation/Gait assistance: Min guard Gait Distance (Feet): 22 Feet Assistive device: Rolling walker (2 wheels)         General Gait Details: cues for sequence, RW position, improved UE control to limit R foot landing; pt demonstrates good adherence to NWB but does fatigue easily; min/guard for safety  Stairs            Wheelchair Mobility     Tilt Bed    Modified Rankin (Stroke Patients Only)       Balance Overall balance assessment: Needs assistance Sitting-balance support: No upper extremity supported, Feet supported Sitting balance-Leahy Scale: Good     Standing balance support: Reliant on assistive device for balance, Bilateral upper extremity supported, During functional activity Standing balance-Leahy Scale: Poor                               Pertinent Vitals/Pain Pain Assessment Pain Assessment: Faces Faces Pain Scale: Hurts a little bit Pain Location: bil feet/ankle Pain Descriptors / Indicators: Discomfort Pain Intervention(s): Limited activity within patient's tolerance, Monitored during session, Repositioned    Home Living Family/patient expects to be discharged to:: Private residence Living Arrangements: Other relatives Available Help at Discharge: Family Type of Home:  House Home Access: Ramped entrance       Home Layout: One level Home Equipment: Insurance risk surveyor (2 wheels);BSC/3in1;Shower seat;Grab bars - toilet Additional Comments: son works two jobs, unable to assist, parents came to stay however  one has dementia and other parent has "shoulder fx".  Has adjustable bed.  Scooter is rented for 2 months.    Prior Function               Mobility Comments: IND prior to fall; using rolling chair  to mobilize in house. ADLs Comments: Independent prior to fall.     Hand Dominance        Extremity/Trunk Assessment   Upper Extremity Assessment Upper Extremity Assessment: Overall WFL for tasks assessed    Lower Extremity Assessment RLE Deficits / Details: ankle ROM limited by pain and edema; able to df to neutral-NT further d/t incr pain; knee and hip grossly WFL LLE Deficits / Details: ankle NT d/t immobilization; knee and hip grossly WFL; able to move toes       Communication   Communication: No difficulties  Cognition Arousal/Alertness: Awake/alert Behavior During Therapy: WFL for tasks assessed/performed Overall Cognitive Status: Within Functional Limits for tasks assessed                                          General Comments      Exercises     Assessment/Plan    PT Assessment All further PT needs can be met in the next venue of care  PT Problem List         PT Treatment Interventions      PT Goals (Current goals can be found in the Care Plan section)  Acute Rehab PT Goals Patient Stated Goal: "be ambulatory again" PT Goal Formulation: All assessment and education complete, DC therapy (pt to d/c home today)    Frequency       Co-evaluation               AM-PAC PT "6 Clicks" Mobility  Outcome Measure Help needed turning from your back to your side while in a flat bed without using bedrails?: None Help needed moving from lying on your back to sitting on the side of a flat bed without using bedrails?: None Help needed moving to and from a bed to a chair (including a wheelchair)?: A Little Help needed standing up from a chair using your arms (e.g., wheelchair or bedside chair)?: A Little Help needed to walk in hospital  room?: A Little Help needed climbing 3-5 steps with a railing? : Total 6 Click Score: 18    End of Session Equipment Utilized During Treatment: Gait belt Activity Tolerance: Patient tolerated treatment well Patient left: with call bell/phone within reach;in chair;with family/visitor present   PT Visit Diagnosis: Other abnormalities of gait and mobility (R26.89);Difficulty in walking, not elsewhere classified (R26.2)    Time: 0865-7846 PT Time Calculation (min) (ACUTE ONLY): 23 min   Charges:   PT Evaluation $PT Eval Low Complexity: 1 Low   PT General Charges $$ ACUTE PT VISIT: 1 Visit         Rayvion Stumph, PT  Acute Rehab Dept [facility]) [phone]  08/07/2022   [facility] 08/07/2022, 11:56 AM

## 2022-08-08 LAB — HEMOGLOBIN A1C
Hgb A1c MFr Bld: 5.9 % — ABNORMAL HIGH (ref 4.8–5.6)
Mean Plasma Glucose: 123 mg/dL

## 2022-08-12 NOTE — H&P (Signed)
History and Physical    Gery Pray, MD Physician Family Medicine   Accreditation Note    Signed   Date of Service: 08/06/2022  8:24 PM   Signed     Expand All Collapse All  PCP:   Daisy Floro, MD    Chief Complaint:  Constipation   HPI: This is a pleasant 57 year old female with PMHx of left ankle trimalleolar fracture and right ankle sprain.  Patient is sp left ankle ORIF by Dr. Eulah Pont on 07/25/22.  Per patient since before the accident she has not had a bowel movement, since approximately 6/25.  While inpatient she was treated with MiraLAX and Colace, she was discharged on those.  She was told it was use of pain medications.  Per patient she stopped her pain medication the day after discharge.  She is continue to not have a bowel movement, but has now developed severe abdominal pain.  Patient was unable to get home health assist with enemas ordered by physicians, she presented to the ER.   In ER patient was disimpacted twice by EDP.  Patient's pain improved but she still present.  Admission requested for continued care       Review of Systems:  Per HPI   Past Medical History:     Past Medical History:  Diagnosis Date   Cancer (HCC)      skin cancer   Claudication (HCC)     Deviated septum     Environmental allergies     Headache      migraine, visual aura-take aleve immediately to avoid migraines   Hypothyroidism     Muscle cramps      bilateral legs   Nasal turbinate hypertrophy     OA (osteoarthritis)     PONV (postoperative nausea and vomiting)               Past Surgical History:  Procedure Laterality Date   BLADDER SUSPENSION N/A 03/04/2015    Procedure: TRANSVAGINAL TAPE (TVT) PROCEDURE;  Surgeon: Levi Aland, MD;  Location: [facility];  Service: Gynecology;  Laterality: N/A;   CYSTOSCOPY N/A 03/04/2015    Procedure: CYSTOSCOPY;  Surgeon: Levi Aland, MD;  Location: [facility];  Service: Gynecology;  Laterality: N/A;   DILITATION &  CURRETTAGE/HYSTROSCOPY WITH HYDROTHERMAL ABLATION N/A 03/04/2015    Procedure: DILATATION & CURETTAGE/HYSTEROSCOPY WITH HYDROTHERMAL ABLATION;  Surgeon: Levi Aland, MD;  Location: [facility];  Service: Gynecology;  Laterality: N/A;   EYE SURGERY        eye muscle shortening surgery as toddler   ORIF ANKLE FRACTURE Left 07/25/2022    Procedure: LEFT OPEN REDUCTION INTERNAL FIXATION (ORIF) ANKLE FRACTURE;  Surgeon: Sheral Apley, MD;  Location: [facility];  Service: Orthopedics;  Laterality: Left;   TONSILLECTOMY       TUBAL LIGATION       WISDOM TOOTH EXTRACTION              Medications:        Prior to Admission medications   Medication Sig Start Date End Date Taking? Authorizing Provider  oxyCODONE-acetaminophen (PERCOCET/ROXICET) 5-325 MG tablet Take 1 tablet by mouth every 6 (six) hours as needed for severe pain.     Yes [provider]  acetaminophen (TYLENOL) 500 MG tablet Take 2 tablets (1,000 mg total) by mouth every 6 (six) hours as needed for mild pain or moderate pain. 07/28/22     Jenne Pane, PA-C  aspirin EC 81 MG tablet  Take 1 tablet (81 mg total) by mouth 2 (two) times daily. To prevent blood clots for 30 days after surgery. 07/28/22     Jenne Pane, PA-C  baclofen (LIORESAL) 10 MG tablet Take 1 tablet (10 mg total) by mouth 3 (three) times daily as needed for muscle spasms. 07/28/22 07/28/23   Jenne Pane, PA-C  Cholecalciferol (VITAMIN D-3) 125 MCG (5000 UT) TABS Take by mouth.       [provider]  ferrous sulfate 325 (65 FE) MG EC tablet Take 325 mg by mouth daily.       [provider]  fluticasone (FLONASE) 50 MCG/ACT nasal spray Place 1 spray into both nostrils at bedtime.       [provider]  HYDROmorphone (DILAUDID) 2 MG tablet Take 1 tablet (2 mg total) by mouth every 2 (two) hours as needed for severe pain. 03/05/15     Levi Aland, MD  levothyroxine (SYNTHROID) 137 MCG tablet Take 137 mcg by mouth daily before breakfast.  05/25/22     [provider]  meloxicam (MOBIC) 15 MG tablet Take 1 tablet (15 mg total) by mouth daily as needed for pain (and inflammation). 07/28/22     Jenne Pane, PA-C  Multiple Vitamin (MULTIVITAMIN WITH MINERALS) TABS tablet Take 1 tablet by mouth daily.       [provider]  ondansetron (ZOFRAN-ODT) 4 MG disintegrating tablet Take 1 tablet (4 mg total) by mouth every 8 (eight) hours as needed for nausea or vomiting. 07/19/22     Gloris Manchester, MD  ondansetron (ZOFRAN-ODT) 4 MG disintegrating tablet Take 1 tablet (4 mg total) by mouth every 8 (eight) hours as needed for nausea or vomiting. 07/28/22     Jenne Pane, PA-C  oxyCODONE (ROXICODONE) 5 MG immediate release tablet Take 1 tablet (5 mg total) by mouth every 6 (six) hours as needed for severe pain. 07/28/22     Jenne Pane, PA-C      Allergies:   Allergies       Allergies  Allergen Reactions   Codeine        Other Reaction(s): nv   Hydrocodone Nausea And Vomiting   Oxycodone Nausea And Vomiting        Social History:  reports that she quit smoking about 24 years ago. Her smoking use included cigarettes. She started smoking about 39 years ago. She has a 7.5 pack-year smoking history. She has never used smokeless tobacco. She reports current alcohol use. She reports that she does not use drugs.   Family History: History reviewed. No pertinent family history.       Physical Exam:       Vitals:    08/06/22 1535 08/06/22 1600 08/06/22 1700 08/06/22 1900  BP: (!) 150/83 (!) 155/71 (!) 155/59    Pulse: (!) 109 86 80    Resp: 20 19 18     Temp: 98.3 F (36.8 C)     98.3 F (36.8 C)  SpO2: 99% 100% 98%        General:  A&O x3, well developed and nourished, no acute distress Eyes: Pink conjunctiva, no scleral icterus ENT: Moist oral mucosa, neck supple, no thyromegaly Lungs: CTA B/L,  no use of accessory muscles Cardiovascular: RRR. No carotid bruits, no JVD Abdomen: soft, positive BS, full  nontender abdomen not an acute abdomen GU: not examined Neuro: CN II - XII grossly intact, sensation intact Musculoskeletal: non weight bearing on LLE in cast.  RLE in walking boot  Skin: no rash, no subcutaneous crepitation, no decubitusno clubbing Psych: appropriate patient   Labs on Admission:  Recent Labs (last 2 labs)      Recent Labs    08/06/22 1537 08/06/22 1830  NA 132* 135  K 4.5 3.8  CL 103 105  CO2 15* 19*  GLUCOSE 165* 136*  BUN 10 8  CREATININE 0.71 0.58  CALCIUM 8.9 8.2*      Recent Labs (last 2 labs)     Recent Labs    08/06/22 1537  AST 61*  ALT 55*  ALKPHOS 111  BILITOT 2.1*  PROT 7.9  ALBUMIN 4.0      Recent Labs (last 2 labs)     Recent Labs    08/06/22 1537  LIPASE 61*      Recent Labs (last 2 labs)     Recent Labs    08/06/22 1537  WBC 10.4  HGB 14.8  HCT 45.8  MCV 90.0  PLT 275          Radiological Exams on Admission:  Imaging Results (Last 48 hours)  CT ABDOMEN PELVIS W CONTRAST   Result Date: 08/06/2022 CLINICAL DATA:  Abdominal pain, constipation EXAM: CT ABDOMEN AND PELVIS WITH CONTRAST TECHNIQUE: Multidetector CT imaging of the abdomen and pelvis was performed using the standard protocol following bolus administration of intravenous contrast. RADIATION DOSE REDUCTION: This exam was performed according to the departmental dose-optimization program which includes automated exposure control, adjustment of the mA and/or kV according to patient size and/or use of iterative reconstruction technique. CONTRAST:  OMNIPAQUE IOHEXOL 300 MG/ML  SOLN COMPARISON:  None Available. FINDINGS: Lower chest: No focal infiltrates are seen in lower lung fields. Small linear density lingula may suggest scarring or subsegmental atelectasis. Hepatobiliary: There is nodularity of the liver surface suggesting cirrhosis. There is fatty infiltration. Calcified gallbladder stones are seen. There is no wall thickening in gallbladder. Pancreas: No  focal abnormalities are seen. Spleen: Spleen measures 12.8 cm in maximum diameter. Adrenals/Urinary Tract: Adrenals are unremarkable. There is no hydronephrosis. Small 2 mm calculus is seen in the upper pole left kidney. Ureters are not dilated. Urinary bladder is unremarkable. Stomach/Bowel: Stomach is not distended. Small bowel loops are not dilated. Appendix is not dilated. There is no significant wall thickening in colon. There is no pericolic stranding. Vascular/Lymphatic: Scattered arterial calcifications are seen. Reproductive: Unremarkable. Other: There is no ascites or pneumoperitoneum. Small umbilical hernia containing fat is seen. Musculoskeletal: Posterior bony spurs are seen at the L5-S1 level. IMPRESSION: There is no evidence of intestinal obstruction or pneumoperitoneum. Appendix is not dilated. There is no hydronephrosis. Fatty liver. Cirrhosis of liver. Gallbladder stone. 2 mm left renal stone. Aortic atherosclerosis. Lumbar spondylosis. Other findings as described in the body of the report. Electronically Signed   By: Ernie Avena M.D.   On: 08/06/2022 17:29       Assessment/Plan Present on Admission:  Constipation -Will clean patient from above and below.  Patient unable to give herself an enema due to her lack of mobility. -Smog enema and mag citrate half bottle ordered -Patient to continue MiraLAX twice daily.  Senna held   left ankle ORIF by Dr. Eulah Pont on 07/25/22  -Patient with cast on LLE.  Nonweightbearing -Patient with walking boot on right -PT consult placed   Hypothyroidism -Synthroid resumed. -TSH ordered as this can contribute to constipation   Metabolic acidossis -Unclear etiology, improving with fluid hydration   Liver cirrhosis likely secondary  to Louanna Raw: Gery Pray, MD 08/06/2022 8:26 PM  For on call review [email or web address].
